# Patient Record
Sex: Male | Born: 1980 | Race: White | Hispanic: No | Marital: Single | Smoking: Current every day smoker
Health system: Southern US, Community
[De-identification: ages and names within clinical notes are randomized; demographics above are authoritative.]

## PROBLEM LIST (undated history)

## (undated) DIAGNOSIS — F419 Anxiety disorder, unspecified: Secondary | ICD-10-CM

## (undated) DIAGNOSIS — A549 Gonococcal infection, unspecified: Secondary | ICD-10-CM

## (undated) DIAGNOSIS — T7840XA Allergy, unspecified, initial encounter: Secondary | ICD-10-CM

## (undated) HISTORY — PX: FEMUR FRACTURE SURGERY: SHX633

## (undated) HISTORY — PX: TIBIA FRACTURE SURGERY: SHX806

## (undated) HISTORY — DX: Allergy, unspecified, initial encounter: T78.40XA

## (undated) HISTORY — PX: JOINT REPLACEMENT: SHX530

## (undated) HISTORY — DX: Anxiety disorder, unspecified: F41.9

---
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
## 2002-02-18 ENCOUNTER — Emergency Department (HOSPITAL_COMMUNITY): Admission: EM | Admit: 2002-02-18 | Discharge: 2002-02-18 | Payer: Self-pay | Admitting: Emergency Medicine

## 2002-03-03 ENCOUNTER — Emergency Department (HOSPITAL_COMMUNITY): Admission: EM | Admit: 2002-03-03 | Discharge: 2002-03-03 | Payer: Self-pay | Admitting: Emergency Medicine

## 2002-12-26 ENCOUNTER — Emergency Department (HOSPITAL_COMMUNITY): Admission: EM | Admit: 2002-12-26 | Discharge: 2002-12-26 | Payer: Self-pay | Admitting: Emergency Medicine

## 2002-12-26 ENCOUNTER — Encounter: Payer: Self-pay | Admitting: Emergency Medicine

## 2003-04-10 ENCOUNTER — Emergency Department (HOSPITAL_COMMUNITY): Admission: EM | Admit: 2003-04-10 | Discharge: 2003-04-10 | Payer: Self-pay | Admitting: Emergency Medicine

## 2003-04-10 ENCOUNTER — Encounter: Payer: Self-pay | Admitting: Emergency Medicine

## 2006-07-14 ENCOUNTER — Emergency Department: Payer: Self-pay | Admitting: Emergency Medicine

## 2006-07-21 ENCOUNTER — Emergency Department: Payer: Self-pay | Admitting: Emergency Medicine

## 2006-07-22 ENCOUNTER — Emergency Department: Payer: Self-pay

## 2006-12-29 ENCOUNTER — Emergency Department: Payer: Self-pay | Admitting: Emergency Medicine

## 2007-01-26 ENCOUNTER — Emergency Department: Payer: Self-pay | Admitting: Emergency Medicine

## 2007-01-26 ENCOUNTER — Emergency Department (HOSPITAL_COMMUNITY): Admission: EM | Admit: 2007-01-26 | Discharge: 2007-01-27 | Payer: Self-pay | Admitting: Emergency Medicine

## 2007-03-08 ENCOUNTER — Ambulatory Visit: Payer: Self-pay | Admitting: Family Medicine

## 2007-07-20 ENCOUNTER — Emergency Department: Payer: Self-pay | Admitting: Emergency Medicine

## 2007-07-23 ENCOUNTER — Emergency Department: Payer: Self-pay | Admitting: Emergency Medicine

## 2007-09-24 ENCOUNTER — Emergency Department: Payer: Self-pay | Admitting: Emergency Medicine

## 2007-12-23 ENCOUNTER — Emergency Department: Payer: Self-pay | Admitting: Emergency Medicine

## 2008-08-26 ENCOUNTER — Emergency Department: Payer: Self-pay | Admitting: Internal Medicine

## 2008-10-30 ENCOUNTER — Emergency Department (HOSPITAL_COMMUNITY): Admission: EM | Admit: 2008-10-30 | Discharge: 2008-10-30 | Payer: Self-pay | Admitting: Emergency Medicine

## 2009-05-26 ENCOUNTER — Emergency Department (HOSPITAL_COMMUNITY): Admission: EM | Admit: 2009-05-26 | Discharge: 2009-05-27 | Payer: Self-pay | Admitting: Emergency Medicine

## 2009-06-02 ENCOUNTER — Emergency Department (HOSPITAL_COMMUNITY): Admission: EM | Admit: 2009-06-02 | Discharge: 2009-06-02 | Payer: Self-pay | Admitting: Emergency Medicine

## 2009-06-10 ENCOUNTER — Emergency Department (HOSPITAL_COMMUNITY): Admission: EM | Admit: 2009-06-10 | Discharge: 2009-06-10 | Payer: Self-pay | Admitting: Emergency Medicine

## 2009-07-16 ENCOUNTER — Emergency Department (HOSPITAL_COMMUNITY): Admission: EM | Admit: 2009-07-16 | Discharge: 2009-07-16 | Payer: Self-pay | Admitting: Emergency Medicine

## 2010-09-26 ENCOUNTER — Emergency Department: Payer: Self-pay | Admitting: Emergency Medicine

## 2010-10-13 ENCOUNTER — Emergency Department (HOSPITAL_COMMUNITY): Admission: EM | Admit: 2010-10-13 | Discharge: 2010-10-13 | Payer: Self-pay | Admitting: Family Medicine

## 2010-10-21 ENCOUNTER — Emergency Department (HOSPITAL_COMMUNITY)
Admission: EM | Admit: 2010-10-21 | Discharge: 2010-10-21 | Payer: Self-pay | Source: Home / Self Care | Admitting: Emergency Medicine

## 2010-11-02 ENCOUNTER — Emergency Department (HOSPITAL_COMMUNITY)
Admission: EM | Admit: 2010-11-02 | Discharge: 2010-11-02 | Payer: Self-pay | Source: Home / Self Care | Admitting: Emergency Medicine

## 2010-12-09 ENCOUNTER — Emergency Department (HOSPITAL_COMMUNITY)
Admission: EM | Admit: 2010-12-09 | Discharge: 2010-12-10 | Payer: Self-pay | Source: Home / Self Care | Admitting: Emergency Medicine

## 2011-01-11 ENCOUNTER — Emergency Department (HOSPITAL_COMMUNITY)
Admission: EM | Admit: 2011-01-11 | Discharge: 2011-01-11 | Disposition: A | Discharge status: Home / Self Care | Payer: Self-pay | Attending: Emergency Medicine | Admitting: Emergency Medicine

## 2011-01-11 ENCOUNTER — Emergency Department (HOSPITAL_COMMUNITY): Payer: Self-pay

## 2011-01-11 DIAGNOSIS — IMO0002 Reserved for concepts with insufficient information to code with codable children: Secondary | ICD-10-CM | POA: Insufficient documentation

## 2011-01-11 DIAGNOSIS — M79609 Pain in unspecified limb: Secondary | ICD-10-CM | POA: Insufficient documentation

## 2011-01-27 LAB — COMPREHENSIVE METABOLIC PANEL
Alkaline Phosphatase: 80 U/L (ref 39–117)
BUN: 15 mg/dL (ref 6–23)
Glucose, Bld: 109 mg/dL — ABNORMAL HIGH (ref 70–99)
Potassium: 4.4 mEq/L (ref 3.5–5.1)
Total Bilirubin: 0.6 mg/dL (ref 0.3–1.2)
Total Protein: 7.2 g/dL (ref 6.0–8.3)

## 2011-01-27 LAB — POCT INFECTIOUS MONO SCREEN: Mono Screen: NEGATIVE

## 2011-01-27 LAB — CBC
HCT: 46.6 % (ref 39.0–52.0)
MCH: 28.1 pg (ref 26.0–34.0)
MCHC: 33.7 g/dL (ref 30.0–36.0)
MCV: 83.5 fL (ref 78.0–100.0)
RDW: 14.3 % (ref 11.5–15.5)
WBC: 12.3 10*3/uL — ABNORMAL HIGH (ref 4.0–10.5)

## 2011-01-27 LAB — DIFFERENTIAL
Basophils Absolute: 0 10*3/uL (ref 0.0–0.1)
Basophils Relative: 0 % (ref 0–1)
Monocytes Relative: 9 % (ref 3–12)
Neutro Abs: 9.8 10*3/uL — ABNORMAL HIGH (ref 1.7–7.7)
Neutrophils Relative %: 80 % — ABNORMAL HIGH (ref 43–77)

## 2011-02-21 LAB — GC/CHLAMYDIA PROBE AMP, GENITAL: GC Probe Amp, Genital: NEGATIVE

## 2011-03-16 ENCOUNTER — Emergency Department: Payer: Self-pay | Admitting: Emergency Medicine
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
## 2011-04-03 NOTE — Op Note (Signed)
[place]. [facility]  Patient:    JAMIEL, GONCALVES                    MRN: [id number] Proc. Date: 05/06/00 Adm. Date:  [id number] Disc. Date: [id number] Attending:  Burnard Bunting                           Operative Report  PREOPERATIVE DIAGNOSIS:  Open lateral leg incision following compartment release.  POSTOPERATIVE DIAGNOSIS:  Open lateral leg incision following compartment release.  PROCEDURE:  Leg irrigation and debridement and delayed primary closure of left lateral incision.  SURGEON:  Cammy Copa, M.D.  ANESTHESIA:  General endotracheal.  ESTIMATED BLOOD LOSS:  5 cc.  DRAINS:  None.  INDICATION FOR PROCEDURE:  Susy Frizzle Dillinger is a 30 year old patient who is now about 6 days out from left tibia IM nailing and compartment release.  The patient presents now for delayed primary closure of his lateral leg incision.  PROCEDURE IN DETAIL:  The patient was brought to the operating room were general endotracheal anesthesia was induced.  Preoperative I.V. antibiotics were administered.  The left leg was prepped with Hibiclens and saline and draped in a sterile manner.  The lateral thigh was also prepped.  The lateral incision was inspected.  It was then irrigated and debrided.  All muscle was viable.  Using manual traction, the skin could be closed primarily without undo tension.  This was performed using interrupted far-and-near, near-and-far 2-0 nylon sutures.  After the incision was closed the leg was wrapped in a bulking changed dressing.  The patient tolerated the procedure well without immediate complications. DD:  05/06/00 TD:  05/08/00 Job: [id number]
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
## 2011-04-03 NOTE — Op Note (Signed)
[place]. [facility]  Patient:    KELAN, PRITT                    MRN: [id number] Proc. Date: 05/02/00 Adm. Date:  [id number] Attending:  Burnard Bunting                           Operative Report  PREOPERATIVE DIAGNOSIS:  Left tibia fracture with compartment syndrome and open incisions.  POSTOPERATIVE DIAGNOSIS:  Left tibia fracture with compartment syndrome and open incisions.  PROCEDURE:  Left tibia fracture, irrigation and debridement of lateral compartment release incision, and medial compartment release incision with closure with reclosure of medial compartment incision over drain and partial closure of lateral incision.  SURGEON:  Cammy Copa, M.D.  ANESTHESIA:  General endotracheal.  ESTIMATED BLOOD LOSS:  50 cc.  DRAINS:  Hemovac x 1.  DESCRIPTION OF PROCEDURE:  The patient was brought to the operating room where general endotracheal anesthesia was induced.  The posterior splint and bulky Jones dressing were removed from the left leg.  A significant amount of serosanguineous drainage was noted to be coming from the closed medial incision.  The lateral incision was intact.  The sutures were removed from the lateral incision.  The muscle itself was viable.  No evidence of infection was noted.  The pulsatile lavage was used to irrigate the area.  The initial defect was oval in size and was approximately 13 cm x 7-8 cm across.  It was partially closed to about half of that area.  Using tension-relieving sutures, the lateral incision was partially closed to about half of that area.  At this time, the medial incision was inspected.  It was partially opened beginning proximally and there was noted to be a significant amount of hematoma within the dead space created by the fracture.  The rest of the incision was evacuated and the hematoma was evacuated.  Pulsatile lavage was used.  The incision was then closed back without tension using  interrupted inverted 2-0 Vicryl followed by 3-0 nylon sutures.  It was closed over a Hemovac drain. At this time, Adaptic and ______ were placed over the lateral incision.  The knee was then closed back.  A bulky Jones dressing and a posterior splint were then reapplied.  The patient tolerated the procedure well without immediate complications. DD:  05/02/00 TD:  05/05/00 Job: [id number]

## 2011-06-01 ENCOUNTER — Emergency Department: Payer: Self-pay | Admitting: Emergency Medicine

## 2011-08-20 LAB — DIFFERENTIAL
Basophils Absolute: 0.1 10*3/uL (ref 0.0–0.1)
Basophils Relative: 0 % (ref 0–1)
Neutro Abs: 13.1 10*3/uL — ABNORMAL HIGH (ref 1.7–7.7)
Neutrophils Relative %: 81 % — ABNORMAL HIGH (ref 43–77)

## 2011-08-20 LAB — POCT CARDIAC MARKERS
CKMB, poc: 1.3 ng/mL (ref 1.0–8.0)
CKMB, poc: <1 ng/mL — ABNORMAL LOW (ref 1.0–8.0)
Myoglobin, poc: 118 ng/mL (ref 12–200)
Myoglobin, poc: 61.8 ng/mL (ref 12–200)
Troponin i, poc: <0.05 ng/mL (ref 0.00–0.09)

## 2011-08-20 LAB — BASIC METABOLIC PANEL
BUN: 13 mg/dL (ref 6–23)
CO2: 23 mEq/L (ref 19–32)
Calcium: 9.6 mg/dL (ref 8.4–10.5)
Creatinine, Ser: 0.89 mg/dL (ref 0.4–1.5)
Glucose, Bld: 89 mg/dL (ref 70–99)

## 2011-08-20 LAB — RAPID URINE DRUG SCREEN, HOSP PERFORMED
Barbiturates: NOT DETECTED
Opiates: NOT DETECTED
Tetrahydrocannabinol: POSITIVE — AB

## 2011-08-20 LAB — CBC
MCHC: 33.8 g/dL (ref 30.0–36.0)
RDW: 14.3 % (ref 11.5–15.5)

## 2011-10-22 ENCOUNTER — Emergency Department (HOSPITAL_COMMUNITY)
Admission: EM | Admit: 2011-10-22 | Discharge: 2011-10-22 | Disposition: A | Discharge status: Home / Self Care | Payer: Self-pay | Attending: Emergency Medicine | Admitting: Emergency Medicine

## 2011-10-22 ENCOUNTER — Encounter: Payer: Self-pay | Admitting: Emergency Medicine

## 2011-10-22 DIAGNOSIS — J111 Influenza due to unidentified influenza virus with other respiratory manifestations: Secondary | ICD-10-CM | POA: Insufficient documentation

## 2011-10-22 MED ORDER — ALBUTEROL SULFATE (5 MG/ML) 0.5% IN NEBU
5.0000 mg | INHALATION_SOLUTION | Freq: Once | RESPIRATORY_TRACT | Status: AC
  Administered 2011-10-22: 5 mg via RESPIRATORY_TRACT
  Filled 2011-10-22: qty 1

## 2011-10-22 MED ORDER — ALBUTEROL SULFATE HFA 108 (90 BASE) MCG/ACT IN AERS
2.0000 | INHALATION_SPRAY | RESPIRATORY_TRACT | Status: DC | PRN
  Administered 2011-10-22: 2 via RESPIRATORY_TRACT
  Filled 2011-10-22: qty 6.7

## 2011-10-22 MED ORDER — IBUPROFEN 800 MG PO TABS
800.0000 mg | ORAL_TABLET | Freq: Once | ORAL | Status: AC
  Administered 2011-10-22: 800 mg via ORAL
  Filled 2011-10-22: qty 1

## 2011-10-22 NOTE — ED Provider Notes (Signed)
History     CSN: [id number] Arrival date & time: 10/22/2011  9:43 PM   First MD Initiated Contact with Patient 10/22/11 2303      Chief Complaint  Patient presents with  . Cough    (Consider location/radiation/quality/duration/timing/severity/associated sxs/prior treatment) HPI  Patient presents to ER complaining of gradual onset cough, body aches, nasal congestion that he woke with this morning and has been persistent throughout the day. Patient states he is currently staying in a shelter and "everyone in the shelter is sick." denies known fevers, but has felt hot and cold. Denies aggravating or alleviating factors. Has not taken anything for symptoms PTA. Denies resp difficulty, wheezing, HA, neck stiffness or sore throat. Denies known medical problems and takes no meds on regular basis.   History reviewed. No pertinent past medical history.  Past Surgical History  Procedure Date  . Joint replacement     No family history on file.  History  Substance Use Topics  . Smoking status: Passive Smoker -- 1.0 packs/day    Types: Cigarettes  . Smokeless tobacco: Not on file  . Alcohol Use:       Review of Systems  All other systems reviewed and are negative.    Allergies  Review of patient's allergies indicates no known allergies.  Home Medications  No current outpatient prescriptions on file.  BP 156/78  Pulse 78  Temp(Src) 98 F (36.7 C) (Oral)  Resp 16  Wt 200 lb (90.719 kg)  SpO2 97%  Physical Exam  Vitals reviewed. Constitutional: He is oriented to person, place, and time. He appears well-developed and well-nourished. No distress.  HENT:  Head: Normocephalic and atraumatic.  Right Ear: External ear normal.  Left Ear: External ear normal.  Nose: Nose normal.  Mouth/Throat: No oropharyngeal exudate.       Mild erythema of posterior pharynx and tonsils no tonsillar exudate or enlargement. Patent airway. Swallowing secretions well  Eyes: Conjunctivae and  EOM are normal. Pupils are equal, round, and reactive to light.  Neck: Normal range of motion. Neck supple.  Cardiovascular: Normal rate, regular rhythm and normal heart sounds.  Exam reveals no gallop and no friction rub.   No murmur heard. Pulmonary/Chest: Effort normal and breath sounds normal. No respiratory distress. He has no wheezes. He has no rales. He exhibits no tenderness.  Abdominal: Soft. He exhibits no distension and no mass. There is no tenderness. There is no rebound and no guarding.  Lymphadenopathy:    He has no cervical adenopathy.  Neurological: He is alert and oriented to person, place, and time. He has normal reflexes.  Skin: Skin is warm and dry. No rash noted. He is not diaphoretic.  Psychiatric: He has a normal mood and affect.    ED Course  Procedures (including critical care time)  Labs Reviewed - No data to display No results found.   1. Influenza       MDM  Non toxic appearing, afebrile. Influenza like symptoms without comorbidities.         Jenness Corner, [place] 10/22/11 2334

## 2011-10-22 NOTE — ED Notes (Signed)
[id number]<AV> Expected date:<BR> Expected time:<BR> Means of arrival:<BR> Comments:<BR> Terminal clean

## 2011-10-22 NOTE — ED Notes (Signed)
Pt alert, nad, c/o cough, body aches, onset this am, resp even unlabored, skin pwd, denies n/v, denies chest pain

## 2011-10-23 NOTE — ED Provider Notes (Signed)
Medical screening examination/treatment/procedure(s) were performed by non-physician practitioner and as supervising physician I was immediately available for consultation/collaboration.  Charmane Protzman Smitty Cords, MD 10/23/11 [phone]

## 2011-11-24 ENCOUNTER — Emergency Department: Payer: Self-pay | Admitting: *Deleted

## 2011-11-24 LAB — URINALYSIS, COMPLETE
Glucose,UR: NEGATIVE mg/dL (ref 0–75)
Leukocyte Esterase: NEGATIVE
Nitrite: NEGATIVE
Ph: 5 (ref 4.5–8.0)
Protein: NEGATIVE
RBC,UR: NONE SEEN /HPF (ref 0–5)

## 2013-01-19 ENCOUNTER — Emergency Department: Payer: Self-pay | Admitting: Emergency Medicine

## 2014-01-14 ENCOUNTER — Emergency Department: Payer: Self-pay | Admitting: Emergency Medicine

## 2014-06-16 ENCOUNTER — Emergency Department: Payer: Self-pay | Admitting: Emergency Medicine

## 2014-06-16 LAB — COMPREHENSIVE METABOLIC PANEL
ALBUMIN: 3.7 g/dL (ref 3.4–5.0)
AST: 26 U/L (ref 15–37)
Alkaline Phosphatase: 70 U/L
Anion Gap: 4 — ABNORMAL LOW (ref 7–16)
BILIRUBIN TOTAL: 0.3 mg/dL (ref 0.2–1.0)
BUN: 12 mg/dL (ref 7–18)
CHLORIDE: 109 mmol/L — AB (ref 98–107)
CO2: 26 mmol/L (ref 21–32)
Calcium, Total: 8.5 mg/dL (ref 8.5–10.1)
Creatinine: 1.11 mg/dL (ref 0.60–1.30)
GLUCOSE: 101 mg/dL — AB (ref 65–99)
OSMOLALITY: 277 (ref 275–301)
Potassium: 4 mmol/L (ref 3.5–5.1)
SGPT (ALT): 25 U/L
Sodium: 139 mmol/L (ref 136–145)
TOTAL PROTEIN: 7.4 g/dL (ref 6.4–8.2)

## 2014-06-16 LAB — DRUG SCREEN, URINE
Amphetamines, Ur Screen: NEGATIVE (ref ?–1000)
Barbiturates, Ur Screen: NEGATIVE (ref ?–200)
Benzodiazepine, Ur Scrn: NEGATIVE (ref ?–200)
Cannabinoid 50 Ng, Ur ~~LOC~~: POSITIVE (ref ?–50)
Cocaine Metabolite,Ur ~~LOC~~: NEGATIVE (ref ?–300)
MDMA (Ecstasy)Ur Screen: NEGATIVE (ref ?–500)
METHADONE, UR SCREEN: NEGATIVE (ref ?–300)
Opiate, Ur Screen: NEGATIVE (ref ?–300)
PHENCYCLIDINE (PCP) UR S: NEGATIVE (ref ?–25)
Tricyclic, Ur Screen: NEGATIVE (ref ?–1000)

## 2014-06-16 LAB — URINALYSIS, COMPLETE
BLOOD: NEGATIVE
Bacteria: NONE SEEN
Bilirubin,UR: NEGATIVE
Glucose,UR: NEGATIVE mg/dL (ref 0–75)
Ketone: NEGATIVE
LEUKOCYTE ESTERASE: NEGATIVE
NITRITE: NEGATIVE
PH: 5 (ref 4.5–8.0)
PROTEIN: NEGATIVE
RBC,UR: <1 /HPF (ref 0–5)
Specific Gravity: 1.023 (ref 1.003–1.030)
Squamous Epithelial: NONE SEEN

## 2014-06-16 LAB — ACETAMINOPHEN LEVEL: Acetaminophen: <2 ug/mL

## 2014-06-16 LAB — CBC
HCT: 46.5 % (ref 40.0–52.0)
HGB: 14.8 g/dL (ref 13.0–18.0)
MCH: 27 pg (ref 26.0–34.0)
MCHC: 31.7 g/dL — AB (ref 32.0–36.0)
MCV: 85 fL (ref 80–100)
Platelet: 347 10*3/uL (ref 150–440)
RBC: 5.45 10*6/uL (ref 4.40–5.90)
RDW: 15.3 % — AB (ref 11.5–14.5)
WBC: 10 10*3/uL (ref 3.8–10.6)

## 2014-06-16 LAB — SALICYLATE LEVEL: Salicylates, Serum: 2 mg/dL

## 2014-06-16 LAB — ETHANOL
ETHANOL %: 0.003 % (ref 0.000–0.080)
ETHANOL LVL: 3 mg/dL

## 2014-07-23 ENCOUNTER — Emergency Department: Payer: Self-pay | Admitting: Student

## 2014-08-04 ENCOUNTER — Emergency Department: Payer: Self-pay | Admitting: Emergency Medicine
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
## 2015-12-28 ENCOUNTER — Encounter (HOSPITAL_COMMUNITY): Payer: Self-pay

## 2015-12-28 ENCOUNTER — Emergency Department (HOSPITAL_COMMUNITY)
Admission: EM | Admit: 2015-12-28 | Discharge: 2015-12-28 | Disposition: A | Discharge status: Home / Self Care | Attending: Emergency Medicine | Admitting: Emergency Medicine

## 2015-12-28 ENCOUNTER — Emergency Department (HOSPITAL_COMMUNITY)

## 2015-12-28 DIAGNOSIS — R1030 Lower abdominal pain, unspecified: Secondary | ICD-10-CM

## 2015-12-28 DIAGNOSIS — N390 Urinary tract infection, site not specified: Secondary | ICD-10-CM | POA: Insufficient documentation

## 2015-12-28 DIAGNOSIS — R110 Nausea: Secondary | ICD-10-CM | POA: Insufficient documentation

## 2015-12-28 DIAGNOSIS — R1084 Generalized abdominal pain: Secondary | ICD-10-CM | POA: Diagnosis present

## 2015-12-28 LAB — URINALYSIS, ROUTINE W REFLEX MICROSCOPIC
Bilirubin Urine: NEGATIVE
Glucose, UA: NEGATIVE mg/dL
Ketones, ur: NEGATIVE mg/dL
LEUKOCYTES UA: NEGATIVE
Nitrite: NEGATIVE
Protein, ur: >300 mg/dL — AB
Specific Gravity, Urine: 1.02 (ref 1.005–1.030)
pH: 5.5 (ref 5.0–8.0)

## 2015-12-28 LAB — URINE MICROSCOPIC-ADD ON

## 2015-12-28 LAB — COMPREHENSIVE METABOLIC PANEL
ALBUMIN: 4.3 g/dL (ref 3.5–5.0)
ALK PHOS: 93 U/L (ref 38–126)
ALT: 18 U/L (ref 17–63)
AST: 22 U/L (ref 15–41)
Anion gap: 11 (ref 5–15)
BILIRUBIN TOTAL: 0.6 mg/dL (ref 0.3–1.2)
BUN: 19 mg/dL (ref 6–20)
CALCIUM: 9 mg/dL (ref 8.9–10.3)
CO2: 24 mmol/L (ref 22–32)
Chloride: 104 mmol/L (ref 101–111)
Creatinine, Ser: 1.66 mg/dL — ABNORMAL HIGH (ref 0.61–1.24)
GFR calc Af Amer: >60 mL/min (ref >60)
GFR calc non Af Amer: 53 mL/min — ABNORMAL LOW (ref >60)
GLUCOSE: 105 mg/dL — AB (ref 65–99)
Potassium: 3.9 mmol/L (ref 3.5–5.1)
Sodium: 139 mmol/L (ref 135–145)
TOTAL PROTEIN: 7.3 g/dL (ref 6.5–8.1)

## 2015-12-28 LAB — CBC WITH DIFFERENTIAL/PLATELET
BASOS ABS: 0 10*3/uL (ref 0.0–0.1)
BASOS PCT: 0 %
EOS PCT: 0 %
Eosinophils Absolute: 0 10*3/uL (ref 0.0–0.7)
HCT: 42.8 % (ref 39.0–52.0)
Hemoglobin: 14.2 g/dL (ref 13.0–17.0)
LYMPHS PCT: 9 %
Lymphs Abs: 1.4 10*3/uL (ref 0.7–4.0)
MCH: 26.9 pg (ref 26.0–34.0)
MCHC: 33.2 g/dL (ref 30.0–36.0)
MCV: 81.2 fL (ref 78.0–100.0)
Monocytes Absolute: 2.2 10*3/uL — ABNORMAL HIGH (ref 0.1–1.0)
Monocytes Relative: 14 %
Neutro Abs: 12 10*3/uL — ABNORMAL HIGH (ref 1.7–7.7)
Neutrophils Relative %: 77 %
Platelets: 377 10*3/uL (ref 150–400)
RBC: 5.27 MIL/uL (ref 4.22–5.81)
RDW: 14.3 % (ref 11.5–15.5)
WBC: 15.7 10*3/uL — ABNORMAL HIGH (ref 4.0–10.5)

## 2015-12-28 MED ORDER — IOHEXOL 300 MG/ML  SOLN
100.0000 mL | Freq: Once | INTRAMUSCULAR | Status: AC | PRN
  Administered 2015-12-28: 100 mL via INTRAVENOUS

## 2015-12-28 MED ORDER — FENTANYL CITRATE (PF) 100 MCG/2ML IJ SOLN
50.0000 ug | Freq: Once | INTRAMUSCULAR | Status: AC
  Administered 2015-12-28: 50 ug via INTRAVENOUS
  Filled 2015-12-28: qty 2

## 2015-12-28 MED ORDER — SODIUM CHLORIDE 0.9 % IV BOLUS (SEPSIS)
1000.0000 mL | Freq: Once | INTRAVENOUS | Status: AC
  Administered 2015-12-28: 1000 mL via INTRAVENOUS

## 2015-12-28 MED ORDER — DEXTROSE 5 % IV SOLN
1.0000 g | Freq: Once | INTRAVENOUS | Status: AC
  Administered 2015-12-28: 1 g via INTRAVENOUS
  Filled 2015-12-28: qty 10

## 2015-12-28 MED ORDER — SODIUM CHLORIDE 0.9 % IV BOLUS (SEPSIS): 1000.0000 mL | Freq: Once | INTRAVENOUS | Status: DC

## 2015-12-28 MED ORDER — CIPROFLOXACIN HCL 500 MG PO TABS: 500.0000 mg | ORAL_TABLET | Freq: Two times a day (BID) | ORAL | Status: DC

## 2015-12-28 MED ORDER — KETOROLAC TROMETHAMINE 30 MG/ML IJ SOLN
30.0000 mg | Freq: Once | INTRAMUSCULAR | Status: AC
  Administered 2015-12-28: 30 mg via INTRAVENOUS
  Filled 2015-12-28: qty 1

## 2015-12-28 MED ORDER — ONDANSETRON HCL 4 MG/2ML IJ SOLN
4.0000 mg | Freq: Once | INTRAMUSCULAR | Status: AC
  Administered 2015-12-28: 4 mg via INTRAVENOUS
  Filled 2015-12-28: qty 2

## 2015-12-28 NOTE — ED Provider Notes (Signed)
CSN: [id number]     Arrival date & time 12/28/15  0118 History   First MD Initiated Contact with Patient 12/28/15 0205   Chief Complaint  Patient presents with  . Abdominal Pain     (Consider location/radiation/quality/duration/timing/severity/associated sxs/prior Treatment) HPI patient reports about 10 PM tonight he started getting diffuse pain below his umbilicus. He can only describe the pain as "hurts". He states the pain is constant. He has had nausea without vomiting or diarrhea. He denies dysuria, frequency, or fever. He states he's never had this pain before. He states coughing and movement makes the pain worse. He states nothing makes it feel better.   PCP none  History reviewed. No pertinent past medical history. Past Surgical History  Procedure Laterality Date  . Joint replacement     No family history on file. Social History  Substance Use Topics  . Smoking status: Passive Smoke Exposure - Never Smoker -- 1.00 packs/day    Types: Cigarettes  . Smokeless tobacco: None  . Alcohol Use: No  incarcerated 9 months Quit smoking 9 months ago  Review of Systems  All other systems reviewed and are negative.     Allergies  Review of patient's allergies indicates no known allergies.  Home Medications   Prior to Admission medications   Medication Sig Start Date End Date Taking? Authorizing Provider  ciprofloxacin (CIPRO) 500 MG tablet Take 1 tablet (500 mg total) by mouth 2 (two) times daily. 12/28/15   Devoria Albe, MD   BP 123/74 mmHg  Pulse 79  Temp(Src) 98.6 F (37 C)  Resp 18  Ht 6' (1.829 m)  Wt 259 lb (117.482 kg)  BMI 35.12 kg/m2  SpO2 99%  Vital signs normal   Physical Exam  Constitutional: He is oriented to person, place, and time. He appears well-developed and well-nourished.  Non-toxic appearance. He does not appear ill. He appears distressed.  Patient is moaning  HENT:  Head: Normocephalic and atraumatic.  Right Ear: External ear normal.  Left  Ear: External ear normal.  Nose: Nose normal. No mucosal edema or rhinorrhea.  Mouth/Throat: Oropharynx is clear and moist and mucous membranes are normal. No dental abscesses or uvula swelling.  Eyes: Conjunctivae and EOM are normal. Pupils are equal, round, and reactive to light.  Neck: Normal range of motion and full passive range of motion without pain. Neck supple.  Cardiovascular: Normal rate, regular rhythm and normal heart sounds.  Exam reveals no gallop and no friction rub.   No murmur heard. Pulmonary/Chest: Effort normal and breath sounds normal. No respiratory distress. He has no wheezes. He has no rhonchi. He has no rales. He exhibits no tenderness and no crepitus.  Abdominal: Soft. Normal appearance and bowel sounds are normal. He exhibits no distension. There is tenderness. There is no rebound and no guarding.  Patient has diffuse abdominal pain and states when I palpate his upper abdomen it hurts in his lower abdomen, he is diffusely tender in the lower abdomen and states the worst pain is in the left lower quadrant.  Musculoskeletal: Normal range of motion. He exhibits no edema or tenderness.  Moves all extremities well.   Neurological: He is alert and oriented to person, place, and time. He has normal strength. No cranial nerve deficit.  Skin: Skin is warm, dry and intact. No rash noted. No erythema. No pallor.  Psychiatric: He has a normal mood and affect. His speech is normal and behavior is normal. His mood appears not anxious.  Nursing  note and vitals reviewed.   ED Course  Procedures (including critical care time)  Medications  sodium chloride 0.9 % bolus 1,000 mL (not administered)  sodium chloride 0.9 % bolus 1,000 mL (0 mLs Intravenous Stopped 12/28/15 0323)  fentaNYL (SUBLIMAZE) injection 50 mcg (50 mcg Intravenous Given 12/28/15 0225)  ondansetron (ZOFRAN) injection 4 mg (4 mg Intravenous Given 12/28/15 0225)  iohexol (OMNIPAQUE) 300 MG/ML solution 100 mL (100 mLs  Intravenous Contrast Given 12/28/15 0339)  sodium chloride 0.9 % bolus 1,000 mL (1,000 mLs Intravenous New Bag/Given 12/28/15 0417)  ketorolac (TORADOL) 30 MG/ML injection 30 mg (30 mg Intravenous Given 12/28/15 0417)  cefTRIAXone (ROCEPHIN) 1 g in dextrose 5 % 50 mL IVPB (1 g Intravenous New Bag/Given 12/28/15 0507)    Patient was given IV fluids, IV pain and nausea medication.  After reviewing patient CT scan he was given IV Toradol for pain because patient was still moaning.  After reviewing patient's urinalysis he was given Rocephin IV. Patient will be discharged home with Cipro twice a day for 2 weeks for possible UTI that is spreading to the renals based on CT scan. Patient should return if he gets high fever, vomiting, or if he is not improving in the next couple days. Patient was given IV fluids for his renal insufficiency. Hopefully this is a volume depletion  and will improve with hydration.   Labs Review Results for orders placed or performed during the hospital encounter of 12/28/15  Comprehensive metabolic panel  Result Value Ref Range   Sodium 139 135 - 145 mmol/L   Potassium 3.9 3.5 - 5.1 mmol/L   Chloride 104 101 - 111 mmol/L   CO2 24 22 - 32 mmol/L   Glucose, Bld 105 (H) 65 - 99 mg/dL   BUN 19 6 - 20 mg/dL   Creatinine, Ser 1.61 (H) 0.61 - 1.24 mg/dL   Calcium 9.0 8.9 - 09.6 mg/dL   Total Protein 7.3 6.5 - 8.1 g/dL   Albumin 4.3 3.5 - 5.0 g/dL   AST 22 15 - 41 U/L   ALT 18 17 - 63 U/L   Alkaline Phosphatase 93 38 - 126 U/L   Total Bilirubin 0.6 0.3 - 1.2 mg/dL   GFR calc non Af Amer 53 (L) >60 mL/min   GFR calc Af Amer >60 >60 mL/min   Anion gap 11 5 - 15  CBC with Differential  Result Value Ref Range   WBC 15.7 (H) 4.0 - 10.5 K/uL   RBC 5.27 4.22 - 5.81 MIL/uL   Hemoglobin 14.2 13.0 - 17.0 g/dL   HCT 04.5 40.9 - 81.1 %   MCV 81.2 78.0 - 100.0 fL   MCH 26.9 26.0 - 34.0 pg   MCHC 33.2 30.0 - 36.0 g/dL   RDW 91.4 78.2 - 95.6 %   Platelets 377 150 - 400 K/uL    Neutrophils Relative % 77 %   Neutro Abs 12.0 (H) 1.7 - 7.7 K/uL   Lymphocytes Relative 9 %   Lymphs Abs 1.4 0.7 - 4.0 K/uL   Monocytes Relative 14 %   Monocytes Absolute 2.2 (H) 0.1 - 1.0 K/uL   Eosinophils Relative 0 %   Eosinophils Absolute 0.0 0.0 - 0.7 K/uL   Basophils Relative 0 %   Basophils Absolute 0.0 0.0 - 0.1 K/uL  Urinalysis, Routine w reflex microscopic  Result Value Ref Range   Color, Urine YELLOW YELLOW   APPearance CLEAR CLEAR   Specific Gravity, Urine 1.020 1.005 - 1.030  pH 5.5 5.0 - 8.0   Glucose, UA NEGATIVE NEGATIVE mg/dL   Hgb urine dipstick SMALL (A) NEGATIVE   Bilirubin Urine NEGATIVE NEGATIVE   Ketones, ur NEGATIVE NEGATIVE mg/dL   Protein, ur >725 (A) NEGATIVE mg/dL   Nitrite NEGATIVE NEGATIVE   Leukocytes, UA NEGATIVE NEGATIVE  Urine microscopic-add on  Result Value Ref Range   Squamous Epithelial / LPF 0-5 (A) NONE SEEN   WBC, UA 6-30 0 - 5 WBC/hpf   RBC / HPF 0-5 0 - 5 RBC/hpf   Bacteria, UA FEW (A) NONE SEEN   Laboratory interpretation all normal except possible UTI, leukocytosis, renal insuffiency   Imaging Review Ct Abdomen Pelvis W Contrast  12/28/2015  CLINICAL DATA:  35 year old male with diffuse lower abdominal pain EXAM: CT ABDOMEN AND PELVIS WITH CONTRAST TECHNIQUE: Multidetector CT imaging of the abdomen and pelvis was performed using the standard protocol following bolus administration of intravenous contrast. CONTRAST:  OMNIPAQUE IOHEXOL 300 MG/ML  SOLN COMPARISON:  None. FINDINGS: The visualized lung bases are clear. No intra-abdominal free air or free fluid. Mild apparent diffuse hepatic steatosis. The liver is otherwise unremarkable. No calcified gallstone identified. The pancreas, spleen, adrenal glands appear unremarkable. There is no hydronephrosis on either side. There is minimal bilateral perinephric haziness. Correlation with urinalysis recommended to exclude UTI. The urinary bladder and the visualized ureters are grossly  unremarkable. The prostate and seminal vesicles appear unremarkable. There is no evidence of bowel obstruction or inflammation. Normal appendix. The abdominal aorta and IVC appear unremarkable. No portal venous gas identified. There is no adenopathy. The abdominal wall soft tissues appear unremarkable. 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 No acute fracture. IMPRESSION: No acute intra-abdominal or pelvic pathology identified. Minimal perinephric haziness. Correlation with urinalysis recommended to exclude UTI. Electronically Signed   By: Elgie Collard M.D.   On: 12/28/2015 04:01   I have personally reviewed and evaluated these images and lab results as part of my medical decision-making.    MDM   Final diagnoses:  Lower abdominal pain  Urinary tract infection without hematuria, site unspecified    New Prescriptions   CIPROFLOXACIN (CIPRO) 500 MG TABLET    Take 1 tablet (500 mg total) by mouth 2 (two) times daily.    Plan discharge  Devoria Albe, MD, Concha Pyo, MD 12/28/15 [phone]

## 2015-12-28 NOTE — Discharge Instructions (Signed)
Drink plenty of fluids. Take the antibiotics twice a day for 2 weeks. You can have ibuprofen 600 mg and/or acetaminophen 1000 mg 4 times a day for pain.   Recheck if you get a fever, vomiting or not improving in the next 2-3 days. A urine culture was sent tonight.    Urinary Tract Infection Urinary tract infections (UTIs) can develop anywhere along your urinary tract. Your urinary tract is your body's drainage system for removing wastes and extra water. Your urinary tract includes two kidneys, two ureters, a bladder, and a urethra. Your kidneys are a pair of bean-shaped organs. Each kidney is about the size of your fist. They are located below your ribs, one on each side of your spine. CAUSES Infections are caused by microbes, which are microscopic organisms, including fungi, viruses, and bacteria. These organisms are so small that they can only be seen through a microscope. Bacteria are the microbes that most commonly cause UTIs. SYMPTOMS  Symptoms of UTIs may vary by age and gender of the patient and by the location of the infection. Symptoms in young women typically include a frequent and intense urge to urinate and a painful, burning feeling in the bladder or urethra during urination. Older women and men are more likely to be tired, shaky, and weak and have muscle aches and abdominal pain. A fever may mean the infection is in your kidneys. Other symptoms of a kidney infection include pain in your back or sides below the ribs, nausea, and vomiting. DIAGNOSIS To diagnose a UTI, your caregiver will ask you about your symptoms. Your caregiver will also ask you to provide a urine sample. The urine sample will be tested for bacteria and white blood cells. White blood cells are made by your body to help fight infection. TREATMENT  Typically, UTIs can be treated with medication. Because most UTIs are caused by a bacterial infection, they usually can be treated with the use of antibiotics. The choice of  antibiotic and length of treatment depend on your symptoms and the type of bacteria causing your infection. HOME CARE INSTRUCTIONS  If you were prescribed antibiotics, take them exactly as your caregiver instructs you. Finish the medication even if you feel better after you have only taken some of the medication.  Drink enough water and fluids to keep your urine clear or pale yellow.  Avoid caffeine, tea, and carbonated beverages. They tend to irritate your bladder.  Empty your bladder often. Avoid holding urine for long periods of time.  Empty your bladder before and after sexual intercourse.  After a bowel movement, women should cleanse from front to back. Use each tissue only once. SEEK MEDICAL CARE IF:   You have back pain.  You develop a fever.  Your symptoms do not begin to resolve within 3 days. SEEK IMMEDIATE MEDICAL CARE IF:   You have severe back pain or lower abdominal pain.  You develop chills.  You have nausea or vomiting.  You have continued burning or discomfort with urination. MAKE SURE YOU:   Understand these instructions.  Will watch your condition.  Will get help right away if you are not doing well or get worse.   This information is not intended to replace advice given to you by your health care provider. Make sure you discuss any questions you have with your health care provider.   Document Released: 08/12/2005 Document Revised: 07/24/2015 Document Reviewed: 12/11/2011 Elsevier Interactive Patient Education [employer]

## 2015-12-28 NOTE — ED Notes (Signed)
Pt reports improvement in pain but states he still "feels" it. CT notified the patient was finished drinking contrast.

## 2015-12-28 NOTE — ED Notes (Signed)
Pt is from [facility] with c/o abd pain that started approx 10 pm, nausea, no vomiting and diarrhea.

## 2015-12-28 NOTE — ED Notes (Signed)
Pt reports diffuse lower abdominal pain with nausea, denies vomiting. Pt also reports chills. Pt denies any trouble with BM or urination

## 2015-12-30 LAB — URINE CULTURE
Culture: NO GROWTH
Special Requests: NORMAL

## 2016-08-20 ENCOUNTER — Ambulatory Visit: Payer: Self-pay | Attending: Orthopedic Surgery | Admitting: Physical Therapy

## 2016-08-20 ENCOUNTER — Encounter: Payer: Self-pay | Admitting: Physical Therapy

## 2016-08-20 DIAGNOSIS — M25661 Stiffness of right knee, not elsewhere classified: Secondary | ICD-10-CM | POA: Insufficient documentation

## 2016-08-20 DIAGNOSIS — M6281 Muscle weakness (generalized): Secondary | ICD-10-CM | POA: Insufficient documentation

## 2016-08-20 DIAGNOSIS — M79604 Pain in right leg: Secondary | ICD-10-CM | POA: Insufficient documentation

## 2016-08-20 DIAGNOSIS — R262 Difficulty in walking, not elsewhere classified: Secondary | ICD-10-CM | POA: Insufficient documentation

## 2016-08-20 DIAGNOSIS — M62838 Other muscle spasm: Secondary | ICD-10-CM | POA: Insufficient documentation
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
## 2016-08-25 ENCOUNTER — Ambulatory Visit: Payer: Self-pay | Admitting: Physical Therapy

## 2016-08-25 ENCOUNTER — Encounter: Payer: Self-pay | Admitting: Physical Therapy

## 2016-08-25 DIAGNOSIS — M79604 Pain in right leg: Secondary | ICD-10-CM

## 2016-08-25 DIAGNOSIS — R262 Difficulty in walking, not elsewhere classified: Secondary | ICD-10-CM

## 2016-08-25 DIAGNOSIS — M62838 Other muscle spasm: Secondary | ICD-10-CM

## 2016-08-25 DIAGNOSIS — M6281 Muscle weakness (generalized): Secondary | ICD-10-CM

## 2016-08-26 NOTE — Therapy (Signed)
[place] [facility] [address] [place], [address] Phone: [phone]   Fax:  [phone]  Physical Therapy Treatment  Patient Details  Name: Peter Stanley MRN: [id number] Date of Birth: [birth year] Referring Provider: Abram Sander MD  Encounter Date: 08/25/2016      PT End of Session - 08/25/16 1715    Visit Number 2   Number of Visits 36   Date for PT Re-Evaluation 11/13/16   PT Start Time 1630   PT Stop Time 1715   PT Time Calculation (min) 45 min   Activity Tolerance Patient limited by pain   Behavior During Therapy [facility] for tasks assessed/performed;Anxious      Past Medical History:  Diagnosis Date  . Allergy   . Anxiety     History reviewed. No pertinent surgical history.  There were no vitals filed for this visit.      Subjective Assessment - 08/25/16 1632    Subjective Patient reports he had improved flexiblity in right knee following patella mobilization and estim. previous session. He still has stiffness in right knee and weakness that limits ability to walk without assistive device.    Limitations Sitting;Standing;Walking;House hold activities;Other (comment)  personal care, work    Patient Stated Goals to be able to perform personal care activites and return to normal activites without limitaitons (prior level of function)   Currently in Pain? Yes   Pain Score 5    Pain Location Leg  right knee   Pain Orientation Right   Pain Descriptors / Indicators Tightness;Spasm;Aching   Pain Type Acute pain;Surgical pain  05/30/2016   Pain Onset More than a month ago   Pain Frequency Constant     Objective:  Gait: ambulating with walker WBAT right LE, right knee in extension, (patient required VC to walk with walker with correct sequencing for safety)  Treatment:  Manual therapy: STM right quadriceps and hamstring  muscles superficial techniques to improve mobility/decrease pain  with patient supine partially reclined and right LE on pillow Patellar mobilization with patella mobilizer: distraction with inferior, medial, lateral glides x 3 reps  Therapeutic exercise: patient performed exercises with guidance, verbal and tactile cues and demonstration of PT: AAROM right knee/ankle/hip 2 x 3 reps Quad setting with and without estim. Assisted SLR 2 x 3 reps  Modalities:  Electrical stimulation; Russian stim. 10/10 cycle right VMO/quadriceps x 20 min. With patient in reclined position with pillow and towel roll under right knee/LE Goal: muscle re education  Patient response to treatment: Patient with improved AAROM to 25/30 degrees knee flexion following STM and exercises with assistance of therapist. Patient with improved motor control and soft tissue elasticity with manual techniques and estim.      PT Education - 08/25/16 1715    Education provided Yes   Education Details HEP: continue with exercises as instructed for quad setting, ROM, walking with increased weight bearing as tolerated   Person(s) Educated Patient   Methods Explanation;Demonstration;Verbal cues   Comprehension Verbalized understanding;Returned demonstration;Verbal cues required             PT Long Term Goals - 08/20/16 1733      PT LONG TERM GOAL #1   Title Patient will improve AAROM right knee up to 90 degrees by 09/16/2016 to allow him to sit and walk with more normal gait pattern and to allow decreased pain in knee   Baseline AAROM right knee 0-10 degrees flexion with pain  Status New     PT LONG TERM GOAL #2   Title patient will improve LEFS to 25/80 by 09/20/16 to demonstrate improving function with daily activities    Baseline LEFS 0/80   Status New     PT LONG TERM GOAL #3   Title patient will have pain level max of 6/10 by 10/20/2016 to allow improved function with moving, sitting and walking   Baseline pain level max 10/10 with aggravating activities   Status New     PT  LONG TERM GOAL #4   Title imrpoved LEFS to 50/80 or better by 11/13/2016 indicating improved function with daily tasks/walking/personal care   Baseline LEFS 0/80   Status New     PT LONG TERM GOAL #5   Title patient will be able to walk with least restrictive device safely on level surfaces and stairs by 11/13/2016 to improve function with household and community activities   Baseline walking with rolling walker, WBAT RLE (non weight bearing due to pain)   Status New     Additional Long Term Goals   Additional Long Term Goals Yes     PT LONG TERM GOAL #6   Title patient will be independent with home program for pain control, flexibiltiy, strength for right LE by discharge 11/13/16 in order to continue to improve function with self management   Baseline requires assistance and guidance for all exercises, limited knowledge of appropriate pain control strategies, progression of exercises    Status New               Plan - 08/25/16 1725    Clinical Impression Statement Patient demonstrated improved ROM from 10 to 25 degrees and improved soft tissue mobiltiy in right thigh and hamstring with treatment. He continues with severe decrease in ROM and  strength right LE and requires additional physical therapy intervention to achieve goals.    Rehab Potential Good   PT Frequency 3x / week   PT Duration 12 weeks   PT Treatment/Interventions Electrical Stimulation;Gait training;Moist Heat;Therapeutic activities;Therapeutic exercise;Balance training;Neuromuscular re-education;Patient/family education;Passive range of motion;Scar mobilization;Manual techniques   PT Next Visit Plan modalities for pain control, muscle spasms, manual soft tissue mobilization, therapeutic exercise for ROM, gait   PT Home Exercise Plan ROM, quad sets every hour, foot intrinsic exercises, pain control      Patient will benefit from skilled therapeutic intervention in order to improve the following deficits and  impairments:  Decreased strength, Decreased balance, Hypomobility, Impaired flexibility, Pain, Impaired perceived functional ability, Decreased activity tolerance, Decreased endurance, Increased muscle spasms, Difficulty walking, Decreased range of motion  Visit Diagnosis: Pain in right leg  Difficulty in walking, not elsewhere classified  Other muscle spasm  Muscle weakness (generalized)     Problem List There are no active problems to display for this patient.   Beacher MayBrooks, Marie PT 08/26/2016, 11:02 PM  [facility] [address] [place], [place], [address] Phone: [phone]   Fax:  [phone]  Name: Fabienne BrunsMatthew T Alcaide MRN: [id number] Date of Birth: [birth year]

## 2016-08-27 ENCOUNTER — Ambulatory Visit: Payer: Self-pay | Admitting: Physical Therapy

## 2016-08-27 DIAGNOSIS — M62838 Other muscle spasm: Secondary | ICD-10-CM

## 2016-08-27 DIAGNOSIS — M6281 Muscle weakness (generalized): Secondary | ICD-10-CM

## 2016-08-27 DIAGNOSIS — M79604 Pain in right leg: Secondary | ICD-10-CM

## 2016-08-27 DIAGNOSIS — R262 Difficulty in walking, not elsewhere classified: Secondary | ICD-10-CM

## 2016-08-28 NOTE — Therapy (Signed)
[place] [facility] [address] [place], [place], [address] Phone: [phone]   Fax:  [phone]  Physical Therapy Treatment  Patient Details  Name: Peter Stanley MRN: [id number] Date of Birth: [birth year] Referring Provider: Abram Sanderpatterson, jennifer MD  Encounter Date: 08/27/2016      PT End of Session - 08/27/16 1731    Visit Number 3   Number of Visits 36   Date for PT Re-Evaluation 11/13/16   PT Start Time 1630   PT Stop Time 1730   PT Time Calculation (min) 60 min   Activity Tolerance Patient limited by pain;Patient limited by fatigue   Behavior [facility] for tasks assessed/performed;Anxious      Past Medical History:  Diagnosis Date  . Allergy   . Anxiety     No past surgical history on file.  There were no vitals filed for this visit.      Subjective Assessment - 08/27/16 1632    Subjective Patient reports he is noticing his right knee is getting looser with exercises and repetition. He still has difficulty with placing right foot on floor and cannot bend his right knee as he would like due to pain.    Limitations Sitting;Standing;Walking;House hold activities;Other (comment)   Patient Stated Goals to be able to perform personal care activites and return to normal activites without limitaitons (prior level of function)   Currently in Pain? Yes   Pain Score 5    Pain Location Knee   Pain Orientation Right   Pain Descriptors / Indicators Tightness;Spasm   Pain Type Acute pain;Surgical pain  05/30/2016   Pain Onset More than a month ago      Objective:  Gait: ambulating with 2 wheeled walker WBAT right LE, right knee in extension, difficulty placing right foot on floor Palpation: decreased soft tissue mobility along right knee, lateral thigh and quadriceps muscle with + spasms palpable throughout thigh, hamstring muslces Treatment:  Manual therapy: STM right quadriceps and hamstring   muscles superficial techniques to improve mobility/decrease pain with patient supine partially reclined and right LE on pillow Patellar mobilization with patella mobilizer: distraction with inferior, medial, lateral glides x 3 reps  Therapeutic exercise: patient performed exercises with guidance, verbal and tactile cues and demonstration of PT: AAROM right knee/ankle/hip 2 x 3 reps with patient supine, reclined with knee supported  Supine lying hip/knee flexion, extension with isometric hold at end ranges extension, flexion of knee 3 sets, 3-5 reps Quad setting with and without estim.  Modalities:  Electrical stimulation; Russian stim. 10/10 cycle right VMO/quadriceps x 20 min. With patient in reclined position with pillow and towel roll under right knee/LE Goal: muscle re education  Patient response to treatment: Patient with improved soft tissue mobility and decreased pain PF joint and quadriceps muscle. Improved AAROM following isometric exercises for knee flexion. AAROM following exercises and STM up to 35 degrees knee flexion. Patient demonstrated improved motor control following estim. Exercises limited by pain in right knee/LE        PT Education - 08/27/16 1730    Education provided Yes   Education Details HEP: instructed in ROM with assistance and demonstated for wife to assist with exercises   Person(s) Educated Patient;Spouse   Methods Explanation;Demonstration;Verbal cues   Comprehension Verbalized understanding;Returned demonstration;Verbal cues required             PT Long Term Goals - 08/20/16 1733      PT LONG TERM GOAL #  1   Title Patient will improve AAROM right knee up to 90 degrees by 09/16/2016 to allow him to sit and walk with more normal gait pattern and to allow decreased pain in knee   Baseline AAROM right knee 0-10 degrees flexion with pain   Status New     PT LONG TERM GOAL #2   Title patient will improve LEFS to 25/80 by 09/20/16 to demonstrate  improving function with daily activities    Baseline LEFS 0/80   Status New     PT LONG TERM GOAL #3   Title patient will have pain level max of 6/10 by 10/20/2016 to allow improved function with moving, sitting and walking   Baseline pain level max 10/10 with aggravating activities   Status New     PT LONG TERM GOAL #4   Title imrpoved LEFS to 50/80 or better by 11/13/2016 indicating improved function with daily tasks/walking/personal care   Baseline LEFS 0/80   Status New     PT LONG TERM GOAL #5   Title patient will be able to walk with least restrictive device safely on level surfaces and stairs by 11/13/2016 to improve function with household and community activities   Baseline walking with rolling walker, WBAT RLE (non weight bearing due to pain)   Status New     Additional Long Term Goals   Additional Long Term Goals Yes     PT LONG TERM GOAL #6   Title patient will be independent with home program for pain control, flexibiltiy, strength for right LE by discharge 11/13/16 in order to continue to improve function with self management   Baseline requires assistance and guidance for all exercises, limited knowledge of appropriate pain control strategies, progression of exercises    Status New               Plan - 08/27/16 1730    Clinical Impression Statement Patient demonstrated improved ROM and soft tissue elasticity following treament. He continues with significant pain in right knee with increased flexion and will require additional physical therapy intervention to achieve full ROM and minimal pain to alow independence with walking and functional tasks.    Rehab Potential Good   PT Frequency 3x / week   PT Duration 12 weeks   PT Treatment/Interventions Electrical Stimulation;Gait training;Moist Heat;Therapeutic activities;Therapeutic exercise;Balance training;Neuromuscular re-education;Patient/family education;Passive range of motion;Scar mobilization;Manual  techniques   PT Next Visit Plan modalities for pain control, muscle spasms, manual soft tissue mobilization, therapeutic exercise for ROM, gait   PT Home Exercise Plan ROM, quad sets every hour, foot intrinsic exercises, pain control, hip/knee flexion with assistance      Patient will benefit from skilled therapeutic intervention in order to improve the following deficits and impairments:  Decreased strength, Decreased balance, Hypomobility, Impaired flexibility, Pain, Impaired perceived functional ability, Decreased activity tolerance, Decreased endurance, Increased muscle spasms, Difficulty walking, Decreased range of motion  Visit Diagnosis: Pain in right leg  Difficulty in walking, not elsewhere classified  Other muscle spasm  Muscle weakness (generalized)     Problem List There are no active problems to display for this patient.   Beacher May PT 08/28/2016, 7:24 PM  [facility]ORTS [facility]IC[facility] [address], [place], [address] Phone: [phone]   Fax:  [phone]  Name: Peter Stanley MRN: [id number] Date of Birth: August 31, 1981

## 2016-08-31 ENCOUNTER — Ambulatory Visit: Payer: Self-pay | Admitting: Physical Therapy

## 2016-08-31 ENCOUNTER — Encounter: Payer: Self-pay | Admitting: Physical Therapy

## 2016-08-31 DIAGNOSIS — M62838 Other muscle spasm: Secondary | ICD-10-CM

## 2016-08-31 DIAGNOSIS — M6281 Muscle weakness (generalized): Secondary | ICD-10-CM

## 2016-08-31 DIAGNOSIS — R262 Difficulty in walking, not elsewhere classified: Secondary | ICD-10-CM

## 2016-08-31 DIAGNOSIS — M79604 Pain in right leg: Secondary | ICD-10-CM

## 2016-08-31 NOTE — Therapy (Signed)
[place] [facility] [address] [place], [address] Phone: [phone]   Fax:  [phone]  Physical Therapy Treatment  Patient Details  Name: Peter Stanley MRN: [id number] Date of Birth: 1981/07/21 Referring Provider: Abram Sander MD  Encounter Date: 08/31/2016      PT End of Session - 08/31/16 1710    Visit Number 4   Number of Visits 36   Date for PT Re-Evaluation 11/13/16   PT Start Time 1617   PT Stop Time 1708   PT Time Calculation (min) 51 min   Activity Tolerance Patient limited by pain;Patient limited by fatigue   Behavior During Therapy [facility] for tasks assessed/performed;Anxious      Past Medical History:  Diagnosis Date  . Allergy   . Anxiety     History reviewed. No pertinent surgical history.  There were no vitals filed for this visit.      Subjective Assessment - 08/31/16 1648    Subjective Patient reports he felt much better following previous session with decreased knee pain and able to sleep better. He is working on [employer] and able to raise leg x 5 reps independently over the weekend one time.    Limitations Sitting;Standing;Walking;House hold activities;Other (comment)   Patient Stated Goals to be able to perform personal care activites and return to normal activites without limitaitons (prior level of function)   Currently in Pain? Yes   Pain Score 5    Pain Location Knee   Pain Orientation Right   Pain Descriptors / Indicators Tightness;Spasm   Pain Type Acute pain;Surgical pain  05/30/2016   Pain Onset More than a month ago   Pain Frequency Constant        Objective:  Gait: ambulating with 2 wheeled walker WBAT right LE, wearing sneaker on right foot and placing foot flat on floor, TDWB Palpation: Decreased soft tissue mobility along right thigh lateral aspect and just proximal to patella, medial hamstring muscle   Treatment:  Manual therapy: STM right quadriceps  and hamstringmusclessuperficial techniques to improve mobility/decrease pain with patient supine partially reclined and right LE on pillow Patellar mobilization with patella mobilizer: distraction with inferior, medial, lateral glides x 3 reps  Therapeutic exercise: patient performed exercises with guidance, verbal and tactile cues and demonstration of PT: AAROM right knee/ankle/hip 2 x 3 reps with patient supine, reclined with knee supported  Supine lying hip/knee flexion, extension with isometric hold at end ranges extension, flexion of knee 3 sets, 3-5 reps Quad setting with and withoutestim.  Modalities:  Electrical stimulation; Russian stim. 10/10 cycle right VMO/quadriceps x .With patient in reclined position with pillow and towel roll under right knee/LEGoal: muscle re education  Patient response to treatment: Patient with improved soft tissue elasticity 50% following manual techniques; decreased pain with knee flexion exercises as compared to previous session. Improved motor control quadriceps following estim., allowing patient to perform SLR with less assistance x 3 reps.         PT Long Term Goals - 08/20/16 1733      PT LONG TERM GOAL #1   Title Patient will improve AAROM right knee up to 90 degrees by 09/16/2016 to allow him to sit and walk with more normal gait pattern and to allow decreased pain in knee   Baseline AAROM right knee 0-10 degrees flexion with pain   Status New     PT LONG TERM GOAL #2   Title patient will improve LEFS to  25/80 by 09/20/16 to demonstrate improving function with daily activities    Baseline LEFS 0/80   Status New     PT LONG TERM GOAL #3   Title patient will have pain level max of 6/10 by 10/20/2016 to allow improved function with moving, sitting and walking   Baseline pain level max 10/10 with aggravating activities   Status New     PT LONG TERM GOAL #4   Title imrpoved LEFS to 50/80 or better by 11/13/2016 indicating  improved function with daily tasks/walking/personal care   Baseline LEFS 0/80   Status New     PT LONG TERM GOAL #5   Title patient will be able to walk with least restrictive device safely on level surfaces and stairs by 11/13/2016 to improve function with household and community activities   Baseline walking with rolling walker, WBAT RLE (non weight bearing due to pain)   Status New     Additional Long Term Goals   Additional Long Term Goals Yes     PT LONG TERM GOAL #6   Title patient will be independent with home program for pain control, flexibiltiy, strength for right LE by discharge 11/13/16 in order to continue to improve function with self management   Baseline requires assistance and guidance for all exercises, limited knowledge of appropriate pain control strategies, progression of exercises    Status New               Plan - 08/31/16 1711    Clinical Impression Statement Patient with improving ROM and soft tissue elasticity, improved ability to perform exercises for ROM right knee flexion and extension with less difficulty/pain. He continues with severe decreased ROM in right knee and weakness that limits function with walking and personal care.    Rehab Potential Good   PT Frequency 3x / week   PT Duration 12 weeks   PT Treatment/Interventions Electrical Stimulation;Gait training;Moist Heat;Therapeutic activities;Therapeutic exercise;Balance training;Neuromuscular re-education;Patient/family education;Passive range of motion;Scar mobilization;Manual techniques   PT Next Visit Plan modalities for pain control, muscle spasms, manual soft tissue mobilization, therapeutic exercise for ROM, gait   PT Home Exercise Plan ROM, quad sets every hour, foot intrinsic exercises, pain control, hip/knee flexion with assistance      Patient will benefit from skilled therapeutic intervention in order to improve the following deficits and impairments:  Decreased strength, Decreased  balance, Hypomobility, Impaired flexibility, Pain, Impaired perceived functional ability, Decreased activity tolerance, Decreased endurance, Increased muscle spasms, Difficulty walking, Decreased range of motion  Visit Diagnosis: Pain in right leg  Difficulty in walking, not elsewhere classified  Other muscle spasm  Muscle weakness (generalized)     Problem List There are no active problems to display for this patient.   Beacher MayBrooks, Allina Riches PT 08/31/2016, 10:25 PM   [facility] [address] [place], [place], [address] Phone: [phone]   Fax:  [phone]  Name: Peter BrunsMatthew T Stanley MRN: [id number] Date of Birth: 01-12-1981

## 2016-09-01 ENCOUNTER — Ambulatory Visit: Payer: Self-pay | Admitting: Physical Therapy

## 2016-09-01 ENCOUNTER — Encounter: Payer: Self-pay | Admitting: Physical Therapy

## 2016-09-01 DIAGNOSIS — M79604 Pain in right leg: Secondary | ICD-10-CM

## 2016-09-01 DIAGNOSIS — M62838 Other muscle spasm: Secondary | ICD-10-CM

## 2016-09-01 DIAGNOSIS — M6281 Muscle weakness (generalized): Secondary | ICD-10-CM

## 2016-09-01 DIAGNOSIS — R262 Difficulty in walking, not elsewhere classified: Secondary | ICD-10-CM

## 2016-09-01 NOTE — Therapy (Signed)
[place] [facility] [address] [place], [address] Phone: [phone]   Fax:  [phone]  Physical Therapy Treatment  Patient Details  Name: Peter Stanley MRN: [id number] Date of Birth: [birth year] Referring Provider: Abram Sander MD  Encounter Date: 09/01/2016      PT End of Session - 09/01/16 1900    Visit Number 5   Number of Visits 36   Date for PT Re-Evaluation 11/13/16   PT Start Time 1805   PT Stop Time 1855   PT Time Calculation (min) 50 min   Activity Tolerance Patient limited by pain;Patient limited by fatigue   Behavior During Therapy [facility] for tasks assessed/performed;Anxious      Past Medical History:  Diagnosis Date  . Allergy   . Anxiety     History reviewed. No pertinent surgical history.  There were no vitals filed for this visit.      Subjective Assessment - 09/01/16 1830    Subjective Patient reports he is feeling looser in his right knee with therapy and is able to sleep better now. HE reports being sore following previous therapy session. He states he has pain with placing left foot on floor with trying to put weight through it.    Limitations Sitting;Standing;Walking;House hold activities;Other (comment)   Patient Stated Goals to be able to perform personal care activites and return to normal activites without limitaitons (prior level of function)   Currently in Pain? Yes   Pain Score 5    Pain Location Knee   Pain Orientation Right   Pain Descriptors / Indicators Tightness;Spasm   Pain Type Acute pain;Surgical pain  05/30/2016   Pain Onset More than a month ago   Pain Frequency Constant      Objective:  Gait: ambulating with 2 wheeled walker WBAT right LE, wearing sneaker on right foot and placing foot flat on floor, TDWB Palpation: Decreased soft tissue mobility along right thigh lateral aspect and just proximal to patella, medial hamstring muscle  AAROM: left  knee flexion 0-30 pre treatment (pain limits further motion)  Treatment:  Manual therapy: STM right quadriceps and hamstringmusclessuperficial techniques to improve mobility/decrease pain with patient supine partially reclined and right LE on pillow Patellar mobilization with patella mobilizer: distraction with inferior, medial, lateral glides x 3 reps  Therapeutic exercise: patient performed exercises with guidance, verbal and tactile cues and demonstration of PT: AAROM right knee/ankle/hip 2 x 3 reps with patient supine, reclined with knee supported  Supine lying hip/knee flexion, extension with isometric hold at end ranges extension, flexion of knee 3 sets, 3-5 reps Hip abduction with assistance with isometric hold end range x 10 reps Sitting assisted knee flexion through partial ROM ~ 40/45 degrees with pain and stiffness noted, 3-5 reps as tolerated Quad setting with and withoutestim.  Modalities:  Electrical stimulation; Russian stim. 10/10 cycle right VMO/quadriceps, high volt estim. Clinical protocol for muscle spasms with electrodes applied to lateral thigh (proximal) and distal medial hamstring muscles,intensity to tolerance   x .With patient in reclined position with pillow and towel roll under right knee/LEGoal: muscle re education, pain control, reduce spasms, improve ROM  Patient response to treatment: Patient demonstrated improved ROM by 10 degrees flexion in right knee with pain limiting motion, improved strength noted with all isometric exercises with repetition, improved motor control with estim. And improved soft tissue elasticity at least 30% with decreased pain with STM, ROM exercises with increased flexibility with treatment.  PT Education - 09/01/16 1840    Education provided Yes   Education Details HEP: work on knee flexion in sitting, knee extension    Person(s) Educated Patient   Methods Explanation;Demonstration   Comprehension  Verbalized understanding             PT Long Term Goals - 08/20/16 1733      PT LONG TERM GOAL #1   Title Patient will improve AAROM right knee up to 90 degrees by 09/16/2016 to allow him to sit and walk with more normal gait pattern and to allow decreased pain in knee   Baseline AAROM right knee 0-10 degrees flexion with pain   Status New     PT LONG TERM GOAL #2   Title patient will improve LEFS to 25/80 by 09/20/16 to demonstrate improving function with daily activities    Baseline LEFS 0/80   Status New     PT LONG TERM GOAL #3   Title patient will have pain level max of 6/10 by 10/20/2016 to allow improved function with moving, sitting and walking   Baseline pain level max 10/10 with aggravating activities   Status New     PT LONG TERM GOAL #4   Title imrpoved LEFS to 50/80 or better by 11/13/2016 indicating improved function with daily tasks/walking/personal care   Baseline LEFS 0/80   Status New     PT LONG TERM GOAL #5   Title patient will be able to walk with least restrictive device safely on level surfaces and stairs by 11/13/2016 to improve function with household and community activities   Baseline walking with rolling walker, WBAT RLE (non weight bearing due to pain)   Status New     Additional Long Term Goals   Additional Long Term Goals Yes     PT LONG TERM GOAL #6   Title patient will be independent with home program for pain control, flexibiltiy, strength for right LE by discharge 11/13/16 in order to continue to improve function with self management   Baseline requires assistance and guidance for all exercises, limited knowledge of appropriate pain control strategies, progression of exercises    Status New               Plan - 09/01/16 1856    Clinical Impression Statement Patient demonstrates improvement with decreased pain and increased flexibility in right knee with treament with gain of 30 degrees of flexion over the past week. He continues  with pain limitiing further motion with flexion and weakness and pain in right LE limiting ability to walk without difficulty or use of walker.    Rehab Potential Good   PT Frequency 3x / week   PT Duration 12 weeks   PT Treatment/Interventions Electrical Stimulation;Gait training;Moist Heat;Therapeutic activities;Therapeutic exercise;Balance training;Neuromuscular re-education;Patient/family education;Passive range of motion;Scar mobilization;Manual techniques   PT Next Visit Plan modalities for pain control, muscle spasms, manual soft tissue mobilization, therapeutic exercise for ROM, gait   PT Home Exercise Plan ROM, quad sets every hour, foot intrinsic exercises, pain control, hip/knee flexion with assistance      Patient will benefit from skilled therapeutic intervention in order to improve the following deficits and impairments:  Decreased strength, Decreased balance, Hypomobility, Impaired flexibility, Pain, Impaired perceived functional ability, Decreased activity tolerance, Decreased endurance, Increased muscle spasms, Difficulty walking, Decreased range of motion  Visit Diagnosis: Pain in right leg  Difficulty in walking, not elsewhere classified  Other muscle spasm  Muscle weakness (generalized)  Problem List There are no active problems to display for this patient.   Beacher May PT 09/02/2016, 5:21 PM  [place] [facility] PHYSICAL AND SPORTS MEDICINE [address], [place], [address] Phone: [phone]   Fax:  [phone]  Name: ORYON GARY MRN: [id number] Date of Birth: January 07, 1981

## 2016-09-02 ENCOUNTER — Ambulatory Visit: Payer: Self-pay | Admitting: Physical Therapy

## 2016-09-03 ENCOUNTER — Encounter: Payer: Self-pay | Admitting: Physical Therapy

## 2016-09-03 ENCOUNTER — Ambulatory Visit: Payer: Self-pay | Admitting: Physical Therapy

## 2016-09-03 DIAGNOSIS — M79604 Pain in right leg: Secondary | ICD-10-CM

## 2016-09-03 DIAGNOSIS — R262 Difficulty in walking, not elsewhere classified: Secondary | ICD-10-CM

## 2016-09-03 DIAGNOSIS — M62838 Other muscle spasm: Secondary | ICD-10-CM

## 2016-09-03 DIAGNOSIS — M6281 Muscle weakness (generalized): Secondary | ICD-10-CM

## 2016-09-03 NOTE — Therapy (Signed)
[facility] [facility] [address] [place], [place], [address] Phone: [phone]   Fax:  [phone]  Physical Therapy Treatment  Patient Details  Name: Peter Stanley MRN: [id number] Date of Birth: [birth year] Referring Provider: Abram Sanderpatterson, jennifer MD  Encounter Date: 09/03/2016      PT End of Session - 09/03/16 1752    Visit Number 6   Number of Visits 36   Date for PT Re-Evaluation 11/13/16   PT Start Time 1720   PT Stop Time 1812   PT Time Calculation (min) 52 min   Activity Tolerance Patient limited by pain;Patient limited by fatigue   Behavior [facility] for [facility] assessed/performed;Anxious      Past Medical History:  Diagnosis Date  . Allergy   . Anxiety     History reviewed. No pertinent surgical history.  There were no vitals filed for this visit.      Subjective Assessment - 09/03/16 1725    Subjective Increased pain in right knee following treatment last session. He is unable to control pain with OTC medication.    Limitations Sitting;Standing;Walking;House hold activities;Other (comment)   Patient Stated Goals to be able to perform personal care activites and return to normal activites without limitaitons (prior level of function)   Currently in Pain? Yes   Pain Score 8    Pain Location Knee   Pain Orientation Right   Pain Descriptors / Indicators Aching;Pressure;Tightness   Pain Type Acute pain;Surgical pain  05/30/2016   Pain Onset More than a month ago   Pain Frequency Constant      Objective:  Gait: ambulating with 2 wheeled walker NWB right LE, wearing sneaker on right foot  Palpation: Decreased soft tissue mobility along right thigh lateral aspect and just proximal to patella, medial hamstring muscle AAROM: left knee flexion 0-30 pre/post treatment (pain limits further motion)  Treatment:  Manual therapy: STM right quadriceps and hamstringmusclessuperficial techniques  to improve mobility/decrease pain with patient supine partially reclined and right LE on pillow Patellar mobilization with patella mobilizer: distraction with inferior, medial, lateral glides x 3 reps  Modalities:  Electrical stimulation; Russian stim. 10/10 cycle right VMO/quadriceps, high volt estim. Clinical protocol for muscle spasms with electrodes applied to lateral thigh (proximal) and distal medial hamstring muscles,intensity to tolerance   x 20min.With patient in reclined position with pillow and towel roll under right knee/LE; Moist heat applied to right knee, hamstring and right lower back QL region in conjunction with estim. (no adverse effects noted) Goal: muscle re education, pain control, reduce spasms, improve ROM  Patient response to treatment: Patient pain level decreased form 8/10 to 3/10 right knee and improved soft tissue elasticity in right quadriceps and hamstring by 50%. ROM remained markedly limited due to pain level. Did not tolerate much exercises today, concentrated on pain control. Soft tissue mobility           PT Education - 09/03/16 1727    Education provided Yes   Education Details HEP: continue with exericses as instructed   Person(s) Educated Patient   Methods Explanation;Demonstration   Comprehension Verbalized understanding             PT Long Term Goals - 08/20/16 1733      PT LONG TERM GOAL #1   Title Patient will improve AAROM right knee up to 90 degrees by 09/16/2016 to allow him to sit and walk with more normal gait pattern and to allow decreased pain  in knee   Baseline AAROM right knee 0-10 degrees flexion with pain   Status New     PT LONG TERM GOAL #2   Title patient will improve LEFS to 25/80 by 09/20/16 to demonstrate improving function with daily activities    Baseline LEFS 0/80   Status New     PT LONG TERM GOAL #3   Title patient will have pain level max of 6/10 by 10/20/2016 to allow improved function with moving,  sitting and walking   Baseline pain level max 10/10 with aggravating activities   Status New     PT LONG TERM GOAL #4   Title imrpoved LEFS to 50/80 or better by 11/13/2016 indicating improved function with daily tasks/walking/personal care   Baseline LEFS 0/80   Status New     PT LONG TERM GOAL #5   Title patient will be able to walk with least restrictive device safely on level surfaces and stairs by 11/13/2016 to improve function with household and community activities   Baseline walking with rolling walker, WBAT RLE (non weight bearing due to pain)   Status New     Additional Long Term Goals   Additional Long Term Goals Yes     PT LONG TERM GOAL #6   Title patient will be independent with home program for pain control, flexibiltiy, strength for right LE by discharge 11/13/16 in order to continue to improve function with self management   Baseline requires assistance and guidance for all exercises, limited knowledge of appropriate pain control strategies, progression of exercises    Status New               Plan - 09/03/16 1813    Clinical Impression Statement Patient demonstrates slow progress due to continued increased pain and spasms with decreased joint and soft tissue mobility. He is having difficulty controlling pain and therefore is not exercising as much at home.    Rehab Potential Good   PT Frequency 3x / week   PT Duration 12 weeks   PT Treatment/Interventions Electrical Stimulation;Gait training;Moist Heat;Therapeutic activities;Therapeutic exercise;Balance training;Neuromuscular re-education;Patient/family education;Passive range of motion;Scar mobilization;Manual techniques   PT Next Visit Plan modalities for pain control, muscle spasms, manual soft tissue mobilization, therapeutic exercise for ROM, gait   PT Home Exercise Plan ROM, quad sets every hour, foot intrinsic exercises, pain control, hip/knee flexion with assistance      Patient will benefit from  skilled therapeutic intervention in order to improve the following deficits and impairments:  Decreased strength, Decreased balance, Hypomobility, Impaired flexibility, Pain, Impaired perceived functional ability, Decreased activity tolerance, Decreased endurance, Increased muscle spasms, Difficulty walking, Decreased range of motion  Visit Diagnosis: Pain in right leg  Difficulty in walking, not elsewhere classified  Other muscle spasm  Muscle weakness (generalized)     Problem List There are no active problems to display for this patient.   Beacher May PT 09/04/2016, 6:00 PM  [place] [facility] PHYSICAL AND SPORTS MEDICINE [address], [place], [address] Phone: [phone]   Fax:  [phone]  Name: Peter Stanley MRN: [id number] Date of Birth: 1981-01-18

## 2016-09-07 ENCOUNTER — Encounter: Payer: Self-pay | Admitting: Physical Therapy

## 2016-09-07 ENCOUNTER — Ambulatory Visit: Payer: Self-pay | Admitting: Physical Therapy

## 2016-09-07 DIAGNOSIS — M79604 Pain in right leg: Secondary | ICD-10-CM

## 2016-09-07 DIAGNOSIS — M6281 Muscle weakness (generalized): Secondary | ICD-10-CM

## 2016-09-07 DIAGNOSIS — R262 Difficulty in walking, not elsewhere classified: Secondary | ICD-10-CM

## 2016-09-07 DIAGNOSIS — M62838 Other muscle spasm: Secondary | ICD-10-CM

## 2016-09-07 NOTE — Therapy (Signed)
[place] [facility] [address], [place], [address] Phone: [phone]   Fax:  [phone]  Physical Therapy Treatment  Patient Details  Name: Peter Stanley MRN: [id number] Date of Birth: 1981/09/21 Referring Provider: Abram Sander MD  Encounter Date: 09/07/2016      PT End of Session - 09/07/16 1943    Visit Number 7   Number of Visits 36   Date for PT Re-Evaluation 11/13/16   PT Start Time 1858   PT Stop Time 1941   PT Time Calculation (min) 43 min   Activity Tolerance Patient limited by pain;Patient limited by fatigue   Behavior During Therapy [facility] for tasks assessed/performed;Anxious      Past Medical History:  Diagnosis Date  . Allergy   . Anxiety     History reviewed. No pertinent surgical history.  There were no vitals filed for this visit.      Subjective Assessment - 09/07/16 1935    Subjective Increased pain in right knee following treatment sessions. He is unable to control pain with OTC medication and would like to have some kind of alternate medication to assist with pain control so he can exercise.    Limitations Sitting;Standing;Walking;House hold activities;Other (comment)   Currently in Pain? Yes   Pain Score 7    Pain Location Knee   Pain Orientation Right   Pain Descriptors / Indicators Aching;Tightness;Pressure   Pain Type Acute pain;Surgical pain  05/30/2016   Pain Onset More than a month ago   Pain Frequency Constant      Objective:  Gait: ambulating with 2 wheeled walker WBAT RLE Palpation: Decreased soft tissue mobility along right thigh lateral thigh close to right hip and just proximal to patella, medial hamstring muscle   Treatment:  Manual therapy: STM right quadriceps and hamstring muscles and along proximal lateral thigh and medial/lateral aspect of right knee AAROM: left knee flexion 0-35 pre/post treatment (pain limits further merficial techniques  to improve mobility/decrease pain with patient supine partially reclined and right LE on pillow Patellar mobilization with patella mobilizer: distraction with inferior, medial, lateral glides x 3 reps  Therapeutic exercise: patient performed with assistance, verbal and tactile cues of therapist: AAROM: right LE knee flexion/extension with graduated motion at tolerated multiple sets with patient partially reclined  Modalities:  Electrical stimulation; Russian stim. 10/10 cycle right VMO/quadriceps, high volt estim. Clinical protocol for muscle spasms with electrodes applied to lateral thigh (proximal) and distal medial hamstring muscles,intensity to tolerance  x .With patient in reclined position with pillow and towel roll under right knee/LE; Moist heat applied to right knee, hamstring in conjunction with estim. (no adverse effects noted) Goal: muscle re education, pain control, reduce spasms, improve ROM  Patient response to treatment: Patient demonstrated improved soft tissue elasticity which allowed improved flexion of right knee through available ROM, He is able to place increased weight on right LE with ambulation with less difficulty. Patient with decreased pain from   7/10 to  4/10. Patient with decreased spasms by 50% following STM. Improved motor control with repetition and cuing, following estim.           PT Education - 09/07/16 1946    Education provided Yes   Education Details HEP: continue with foot, knee and hip exercises for ROM and strength and motor control   Person(s) Educated Patient   Methods Explanation;Demonstration;Verbal cues   Comprehension Verbalized understanding;Returned demonstration;Verbal cues required  PT Long Term Goals - 08/20/16 1733      PT LONG TERM GOAL #1   Title Patient will improve AAROM right knee up to 90 degrees by 09/16/2016 to allow him to sit and walk with more normal gait pattern and to allow decreased pain  in knee   Baseline AAROM right knee 0-10 degrees flexion with pain   Status New     PT LONG TERM GOAL #2   Title patient will improve LEFS to 25/80 by 09/20/16 to demonstrate improving function with daily activities    Baseline LEFS 0/80   Status New     PT LONG TERM GOAL #3   Title patient will have pain level max of 6/10 by 10/20/2016 to allow improved function with moving, sitting and walking   Baseline pain level max 10/10 with aggravating activities   Status New     PT LONG TERM GOAL #4   Title imrpoved LEFS to 50/80 or better by 11/13/2016 indicating improved function with daily tasks/walking/personal care   Baseline LEFS 0/80   Status New     PT LONG TERM GOAL #5   Title patient will be able to walk with least restrictive device safely on level surfaces and stairs by 11/13/2016 to improve function with household and community activities   Baseline walking with rolling walker, WBAT RLE (non weight bearing due to pain)   Status New     Additional Long Term Goals   Additional Long Term Goals Yes     PT LONG TERM GOAL #6   Title patient will be independent with home program for pain control, flexibiltiy, strength for right LE by discharge 11/13/16 in order to continue to improve function with self management   Baseline requires assistance and guidance for all exercises, limited knowledge of appropriate pain control strategies, progression of exercises    Status New               Plan - 09/07/16 1944    Clinical Impression Statement Patient demonstrates slow progress due to moderate to severe pain that limits abiltiy to perform knee flexion activities for any extended period of time. He is progressing with improving soft tissue elasticity that allows improvement with knee flexion however there is fair carry over due to pain not well controlled at home.  He is seeing MD and will discuss other alternatives for controlling pain so he may continue to improve and return to  prior level of function.   Rehab Potential Good   PT Frequency 3x / week   PT Duration 12 weeks   PT Treatment/Interventions Electrical Stimulation;Gait training;Moist Heat;Therapeutic activities;Therapeutic exercise;Balance training;Neuromuscular re-education;Patient/family education;Passive range of motion;Scar mobilization;Manual techniques   PT Next Visit Plan modalities for pain control, muscle spasms, manual soft tissue mobilization, therapeutic exercise for ROM, gait   PT Home Exercise Plan ROM, quad sets every hour, foot intrinsic exercises, pain control, hip/knee flexion with assistance      Patient will benefit from skilled therapeutic intervention in order to improve the following deficits and impairments:  Decreased strength, Decreased balance, Hypomobility, Impaired flexibility, Pain, Impaired perceived functional ability, Decreased activity tolerance, Decreased endurance, Increased muscle spasms, Difficulty walking, Decreased range of motion  Visit Diagnosis: Pain in right leg  Difficulty in walking, not elsewhere classified  Other muscle spasm  Muscle weakness (generalized)     Problem List There are no active problems to display for this patient.   Beacher MayBrooks, Marie PT 09/08/2016, 2:18 PM  [place]  [facility] [address], [place], [address] Phone: [phone]   Fax:  [phone]  Name: Peter Stanley MRN: [id number] Date of Birth: [birth year]

## 2016-09-08 ENCOUNTER — Ambulatory Visit: Payer: Self-pay | Admitting: Physical Therapy

## 2016-09-09 ENCOUNTER — Encounter: Payer: Self-pay | Admitting: Physical Therapy

## 2016-09-10 ENCOUNTER — Encounter: Payer: Self-pay | Admitting: Physical Therapy

## 2016-09-10 ENCOUNTER — Ambulatory Visit: Payer: Self-pay | Admitting: Physical Therapy

## 2016-09-10 DIAGNOSIS — R262 Difficulty in walking, not elsewhere classified: Secondary | ICD-10-CM

## 2016-09-10 DIAGNOSIS — M62838 Other muscle spasm: Secondary | ICD-10-CM

## 2016-09-10 DIAGNOSIS — M6281 Muscle weakness (generalized): Secondary | ICD-10-CM

## 2016-09-10 DIAGNOSIS — M79604 Pain in right leg: Secondary | ICD-10-CM

## 2016-09-11 NOTE — Therapy (Signed)
[facility] PHYS[facility] AND SPORTS MEDICINE [address] [place], [address] Phone: [phone]   Fax:  [phone]  Physical Therapy Treatment  Patient Details  Name: Peter Stanley MRN: [id number] Date of Birth: 1981/05/21 Referring Provider: Abram Sander MD  Encounter Date: 09/10/2016      PT End of Session - 09/10/16 1520    Visit Number 8   Number of Visits 36   Date for PT Re-Evaluation 11/13/16   PT Start Time 1630   PT Stop Time 1710   PT Time Calculation (min) 40 min   Activity Tolerance Patient limited by pain;Patient limited by fatigue   Behavior During Therapy [facility] for tasks assessed/performed;Anxious      Past Medical History:  Diagnosis Date  . Allergy   . Anxiety     History reviewed. No pertinent surgical history.  There were no vitals filed for this visit.      Subjective Assessment - 09/10/16 1652    Subjective Pain and stiffness in right knee, able to walk with walker into MD office the other day and was exhausted after getting home.     Limitations Sitting;Standing;Walking;House hold activities;Other (comment)   Patient Stated Goals to be able to perform personal care activites and return to normal activites without limitaitons (prior level of function)   Currently in Pain? Yes   Pain Score 7    Pain Location Knee   Pain Orientation Right   Pain Descriptors / Indicators Aching;Tightness;Spasm   Pain Type Acute pain;Surgical pain  05/30/2016   Pain Onset More than a month ago   Pain Frequency Constant      Objective:  Gait: ambulating with 2 wheeled walker WBAT RLE; placing right foot flat on floor Palpation: Decreased soft tissue mobility along right thigh lateral thigh close to right hip and just proximal to patella, medial hamstring muscle and anterior thigh with increased pain on palpation of quadriceps spasms  AAROM: left knee flexion 0-35 pre/posttreatment (pain limits further  motion)  Treatment:  Manual therapy: STM right quadriceps and hamstring muscles and along proximal lateral thigh, anterior thigh and medial/lateral aspect of right knee; goal: to improve mobility/decrease pain with patient supine partially reclined and right LE on pillow Patellar mobilization with patella mobilizer: distraction with inferior, medial, lateral glides x 3 reps  Therapeutic exercise: patient performed with assistance, verbal and tactile cues of therapist: AAROM: right LE knee flexion/extension with graduated motion at tolerated multiple sets with patient partially reclined TKE x 5 reps with hold in extension 2-3 seconds (very difficult and using lots of effort)  Modalities:  Electrical stimulation; Russian stim. 10/10 cycle right VMO/quadriceps, high volt estim. Clinical protocol for muscle spasms with electrodes applied to lateral thigh (proximal) and distal medial hamstring muscles,intensity to tolerance  x .With patient in reclined position with pillow and towel roll under right knee/LE; Moist heat applied to right knee, hamstring in conjunction with estim. (no adverse effects noted) Goal: muscle re education, pain control, reduce spasms, improve ROM  Patient response to treatment: Patient with improved soft tissue elasticity in quadriceps, hamstring and able to tolerate minimal knee flexion today due to pain level remaining high 5-7/10. Patient verbalized understanding of home program for exercise, assisted by wife and weight shifting exercises.        PT Education - 09/10/16 1715    Education provided Yes   Education Details HEP; instructed to have wife assist with hip/knee flexion right LE, continue use of heat  and begin more AAROM and standing weight shifting side to side as tolerated   Person(s) Educated Patient   Methods Explanation;Demonstration   Comprehension Verbalized understanding             PT Long Term Goals - 08/20/16 1733      PT LONG  TERM GOAL #1   Title Patient will improve AAROM right knee up to 90 degrees by 09/16/2016 to allow him to sit and walk with more normal gait pattern and to allow decreased pain in knee   Baseline AAROM right knee 0-10 degrees flexion with pain   Status New     PT LONG TERM GOAL #2   Title patient will improve LEFS to 25/80 by 09/20/16 to demonstrate improving function with daily activities    Baseline LEFS 0/80   Status New     PT LONG TERM GOAL #3   Title patient will have pain level max of 6/10 by 10/20/2016 to allow improved function with moving, sitting and walking   Baseline pain level max 10/10 with aggravating activities   Status New     PT LONG TERM GOAL #4   Title imrpoved LEFS to 50/80 or better by 11/13/2016 indicating improved function with daily tasks/walking/personal care   Baseline LEFS 0/80   Status New     PT LONG TERM GOAL #5   Title patient will be able to walk with least restrictive device safely on level surfaces and stairs by 11/13/2016 to improve function with household and community activities   Baseline walking with rolling walker, WBAT RLE (non weight bearing due to pain)   Status New     Additional Long Term Goals   Additional Long Term Goals Yes     PT LONG TERM GOAL #6   Title patient will be independent with home program for pain control, flexibiltiy, strength for right LE by discharge 11/13/16 in order to continue to improve function with self management   Baseline requires assistance and guidance for all exercises, limited knowledge of appropriate pain control strategies, progression of exercises    Status New               Plan - 09/10/16 1720    Clinical Impression Statement Patient is now progressing with weight bearing at home and will be able to begin gait activities including weight shifting activieis. He continues with significant decreased ROM with severe pain and severe decreased strength right LE and will require continued physical  therapy intervention to address limitations in order to return to PLOF.   Rehab Potential Good   PT Frequency 3x / week   PT Duration 12 weeks   PT Treatment/Interventions Electrical Stimulation;Gait training;Moist Heat;Therapeutic activities;Therapeutic exercise;Balance training;Neuromuscular re-education;Patient/family education;Passive range of motion;Scar mobilization;Manual techniques   PT Next Visit Plan modalities for pain control, muscle spasms, manual soft tissue mobilization, therapeutic exercise for ROM, gait   PT Home Exercise Plan ROM, quad sets every hour, foot intrinsic exercises, pain control, hip/knee flexion with assistance, weight shifting side to side in standing      Patient will benefit from skilled therapeutic intervention in order to improve the following deficits and impairments:  Decreased strength, Decreased balance, Hypomobility, Impaired flexibility, Pain, Impaired perceived functional ability, Decreased activity tolerance, Decreased endurance, Increased muscle spasms, Difficulty walking, Decreased range of motion  Visit Diagnosis: Pain in right leg  Difficulty in walking, not elsewhere classified  Other muscle spasm  Muscle weakness (generalized)     Problem List There are  no active problems to display for this patient.   Beacher May PT 09/11/2016, 1:32 PM   [facility] PHYSICAL AND SPORTS MEDICINE [address], [place], [address] Phone: [phone]   Fax:  [phone]  Name: HELTON OLESON MRN: [id number] Date of Birth: [birth year]

## 2016-09-14 ENCOUNTER — Encounter: Payer: Self-pay | Admitting: Physical Therapy

## 2016-09-14 ENCOUNTER — Ambulatory Visit: Payer: Self-pay | Admitting: Physical Therapy

## 2016-09-14 DIAGNOSIS — M79604 Pain in right leg: Secondary | ICD-10-CM

## 2016-09-14 DIAGNOSIS — M62838 Other muscle spasm: Secondary | ICD-10-CM

## 2016-09-14 DIAGNOSIS — M6281 Muscle weakness (generalized): Secondary | ICD-10-CM

## 2016-09-14 DIAGNOSIS — R262 Difficulty in walking, not elsewhere classified: Secondary | ICD-10-CM

## 2016-09-15 ENCOUNTER — Encounter: Payer: Self-pay | Admitting: Physical Therapy

## 2016-09-15 ENCOUNTER — Ambulatory Visit: Payer: Self-pay | Admitting: Physical Therapy

## 2016-09-15 DIAGNOSIS — M62838 Other muscle spasm: Secondary | ICD-10-CM

## 2016-09-15 DIAGNOSIS — R262 Difficulty in walking, not elsewhere classified: Secondary | ICD-10-CM

## 2016-09-15 DIAGNOSIS — M25661 Stiffness of right knee, not elsewhere classified: Secondary | ICD-10-CM

## 2016-09-15 DIAGNOSIS — M79604 Pain in right leg: Secondary | ICD-10-CM

## 2016-09-15 DIAGNOSIS — M6281 Muscle weakness (generalized): Secondary | ICD-10-CM

## 2016-09-15 NOTE — Therapy (Signed)
[facility] [address] [place], [place], [address] Phone: [phone]   Fax:  [phone]  Physical Therapy Treatment  Patient Details  Name: Fabienne BrunsMatthew T Jicha MRN: [id number] Date of Birth: 10/11/1981 Referring Provider: Abram Sanderpatterson, jennifer MD  Encounter Date: 09/14/2016      PT End of Session - 09/14/16 1640    Visit Number 9   Number of Visits 36   Date for PT Re-Evaluation 11/13/16   PT Start Time 1625   PT Stop Time 1708   PT Time Calculation (min) 43 min   Activity Tolerance Patient limited by pain;Patient limited by fatigue   Behavior [facility] assessed/performed;Anxious      Past Medical History:  Diagnosis Date  . Allergy   . Anxiety     History reviewed. No pertinent surgical history.  There were no vitals filed for this visit.      Subjective Assessment - 09/14/16 1637    Subjective Pain/stiffness in right knee. Now getting more feeling in right foot and able to put more weight on his leg with walking and standing.    Limitations Sitting;Standing;Walking;House hold activities;Other (comment)   Patient Stated Goals to be able to perform personal care activites and return to normal activites without limitaitons (prior level of function)   Currently in Pain? Yes   Pain Score 5    Pain Location Shoulder   Pain Orientation Right   Pain Descriptors / Indicators Aching;Tightness   Pain Type Acute pain;Surgical pain  05/30/2016   Pain Onset More than a month ago   Pain Frequency Constant        Objective:  Gait: ambulating with 2 wheeled walker WBAT RLE; improved weight bearing right LE noted Palpation: Decreased soft tissue mobility along right thigh lateral thigh close to right hip, medial hamstring muscle and anterior thigh with increased pain on palpation of quadriceps spasms  AAROM: right knee flexion 0-30 Strength/motor control: improved quadriceps  contraction/setting   Treatment:  Manual therapy: STM right quadriceps and hamstring muscles and along proximal lateral thigh, anterior thigh and medial/lateral aspect of right knee; goal: to improve mobility/decrease pain with patient supine partially reclined and right LE on pillow Patellar mobilization with patella mobilizer: distraction with inferior, medial, lateral glides x 3 reps  Therapeutic exercise: patient performed with assistance, verbal and tactile cues of therapist: AAROM: right LE knee flexion/extension with graduated motion at tolerated multiple sets with patient partially reclined TKE x 5 reps with hold in extension 2-3 seconds (very difficult and using lots of effort) Ankle DF, PF,  Eversion with yellow resistive band x 10 reps each   Modalities:  Electrical stimulation; Russian stim. 10/10 cycle right VMO/quadriceps, high volt estim. Clinical protocol for muscle spasms with electrodes applied to lateral thigh (proximal) and distal medial hamstring muscles,intensity to tolerance  x 20min.With patient in reclined position with pillow and towel roll under right knee/LE; Moist heat applied to right knee, hamstring in conjunction with estim. (no adverse effects noted) Goal: muscle re education, pain control, reduce spasms, improve ROM  Patient response to treatment: Patient demonstrated improved technique with exercises with guidance, repetition.  Patient with decreased spasms by 30% following STM,decreased tolerance due to pain. Improved motor control with repetition and cuing, following estim.        PT Education - 09/14/16 1639    Education provided Yes   Education Details HEP: instructed in ankle resistive band exercises for DF/PF and eversion  Person(s) Educated Patient   Methods Explanation;Demonstration   Comprehension Verbalized understanding;Returned demonstration;Verbal cues required             PT Long Term Goals - 08/20/16 1733      PT LONG  TERM GOAL #1   Title Patient will improve AAROM right knee up to 90 degrees by 09/16/2016 to allow him to sit and walk with more normal gait pattern and to allow decreased pain in knee   Baseline AAROM right knee 0-10 degrees flexion with pain   Status New     PT LONG TERM GOAL #2   Title patient will improve LEFS to 25/80 by 09/20/16 to demonstrate improving function with daily activities    Baseline LEFS 0/80   Status New     PT LONG TERM GOAL #3   Title patient will have pain level max of 6/10 by 10/20/2016 to allow improved function with moving, sitting and walking   Baseline pain level max 10/10 with aggravating activities   Status New     PT LONG TERM GOAL #4   Title imrpoved LEFS to 50/80 or better by 11/13/2016 indicating improved function with daily tasks/walking/personal care   Baseline LEFS 0/80   Status New     PT LONG TERM GOAL #5   Title patient will be able to walk with least restrictive device safely on level surfaces and stairs by 11/13/2016 to improve function with household and community activities   Baseline walking with rolling walker, WBAT RLE (non weight bearing due to pain)   Status New     Additional Long Term Goals   Additional Long Term Goals Yes     PT LONG TERM GOAL #6   Title patient will be independent with home program for pain control, flexibiltiy, strength for right LE by discharge 11/13/16 in order to continue to improve function with self management   Baseline requires assistance and guidance for all exercises, limited knowledge of appropriate pain control strategies, progression of exercises    Status New               Plan - 09/14/16 1641    Clinical Impression Statement Patient progressing slowly due to significant pain and spasms in right LE that limit abiltiy to perform exercises. He is well motivated and eager to progress to standing and increased flexion exercises.    Rehab Potential Good   PT Frequency 3x / week   PT Duration  12 weeks   PT Treatment/Interventions Electrical Stimulation;Gait training;Moist Heat;Therapeutic activities;Therapeutic exercise;Balance training;Neuromuscular re-education;Patient/family education;Passive range of motion;Scar mobilization;Manual techniques   PT Next Visit Plan modalities for pain control, muscle spasms, manual soft tissue mobilization, therapeutic exercise for ROM, gait; NuStep; standing sway on balance pad   PT Home Exercise Plan ROM, quad sets every hour, foot intrinsic exercises, pain control, hip/knee flexion with assistance, weight shifting side to side in standing      Patient will benefit from skilled therapeutic intervention in order to improve the following deficits and impairments:  Decreased strength, Decreased balance, Hypomobility, Impaired flexibility, Pain, Impaired perceived functional ability, Decreased activity tolerance, Decreased endurance, Increased muscle spasms, Difficulty walking, Decreased range of motion  Visit Diagnosis: Pain in right leg  Difficulty in walking, not elsewhere classified  Other muscle spasm  Muscle weakness (generalized)     Problem List There are no active problems to display for this patient.   Beacher MayBrooks, Caryl Fate PT 09/15/2016, 2:27 PM  [facility]  [facility]ICINE [address] [place], [address] Phone: [phone]   Fax:  [phone]  Name: EUGENIO DOLLINS MRN: [id number] Date of Birth: June 11, 1981

## 2016-09-15 NOTE — Therapy (Signed)
[place] [facility] [address] [place], [place], [address] Phone: [phone]   Fax:  [phone]  Physical Therapy Treatment  Patient Details  Name: Peter Stanley MRN: [id number] Date of Birth: August 05, 1981 Referring Provider: Abram Sanderpatterson, jennifer MD  Encounter Date: 09/15/2016      PT End of Session - 09/15/16 1900    Visit Number 10   Number of Visits 36   Date for PT Re-Evaluation 11/13/16   PT Start Time 1800   PT Stop Time 1850   PT Time Calculation (min) 50 min   Activity Tolerance Patient limited by pain;Patient limited by fatigue   Behavior [facility] assessed/per[facility]      Past Medical History:  Diagnosis Date  . Allergy   . Anxiety     History reviewed. No pertinent surgical history.  There were no vitals filed for this visit.      Subjective Assessment - 09/15/16 1835    Subjective Patient reports increased pain and soreness, stiffness left knee today. eager to go on Nustep.    Limitations Sitting;Standing;Walking;House hold activities;Other (comment)   Patient Stated Goals to be able to perform personal care activites and return to normal activites without limitaitons (prior level of function)   Currently in Pain? Yes   Pain Score 7    Pain Location Knee   Pain Orientation Right   Pain Descriptors / Indicators Aching;Sore;Spasm;Tightness   Pain Type Acute pain;Surgical pain  05/30/2016   Pain Onset More than a month ago   Pain Frequency Constant      Objective:  Gait: ambulating with 2 wheeled walker WBAT RLE; placing right foot flat on floor Palpation: Decreased soft tissue mobility along right thigh, lateral thigh close to right hipand just proximal to patella,  anterior thigh with increased pain on palpation of quadriceps spasms    Treatment:  Manual therapy: STM right quadriceps, along proximal lateral thigh, anterior thigh  goal: to improve mobility/decrease  pain with patient supine partially reclined and right LE on pillow Patellar mobilization with patella mobilizer: distraction with inferior, medial, lateral glides x 3 reps  Therapeutic exercise: patient performed with assistance, verbal and tactile cues of therapist: AAROM: right LE knee flexion/extension with graduated motion at tolerated multiple sets with patient partially reclined TKE x 5 reps with hold in extension 2-3 seconds (very difficult and using lots of effort) Nustep x 3 min. With assistance of therapist on/off machine and to support right knee to be able to perform knee flexion   Modalities:  Electrical stimulation; Russian stim. 10/10 cycle right VMO/quadriceps, high volt estim. Clinical protocol for muscle spasms with electrodes applied to lateral thigh over spasms, intensity to tolerance  x 20min.With patient in reclined position with pillow under right knee/LE; Moist heat applied to right knee, hamstring in conjunction with estim. (no adverse effects noted) Goal: muscle re education, pain control, reduce spasms, improve ROM  Patient response to treatment: Patient demonstrated improved technique with exercises with moderate assistance to support right LE and moderate VC.  Patient with decreased spasms by 50%  And pain/tenderness by 25%, following STM, moist heat and estim.  Improved motor control with repetition and cuing         PT Education - 09/15/16 1900    Education provided Yes   Education Details HEP: continue with ROM exercises as tolerated throughout the day, use heat for warm up   Person(s) Educated Patient   Methods Explanation  Comprehension Verbalized understanding             PT Long Term Goals - 08/20/16 1733      PT LONG TERM GOAL #1   Title Patient will improve AAROM right knee up to 90 degrees by 09/16/2016 to allow him to sit and walk with more normal gait pattern and to allow decreased pain in knee   Baseline AAROM right knee 0-10  degrees flexion with pain   Status New     PT LONG TERM GOAL #2   Title patient will improve LEFS to 25/80 by 09/20/16 to demonstrate improving function with daily activities    Baseline LEFS 0/80   Status New     PT LONG TERM GOAL #3   Title patient will have pain level max of 6/10 by 10/20/2016 to allow improved function with moving, sitting and walking   Baseline pain level max 10/10 with aggravating activities   Status New     PT LONG TERM GOAL #4   Title imrpoved LEFS to 50/80 or better by 11/13/2016 indicating improved function with daily tasks/walking/personal care   Baseline LEFS 0/80   Status New     PT LONG TERM GOAL #5   Title patient will be able to walk with least restrictive device safely on level surfaces and stairs by 11/13/2016 to improve function with household and community activities   Baseline walking with rolling walker, WBAT RLE (non weight bearing due to pain)   Status New     Additional Long Term Goals   Additional Long Term Goals Yes     PT LONG TERM GOAL #6   Title patient will be independent with home program for pain control, flexibiltiy, strength for right LE by discharge 11/13/16 in order to continue to improve function with self management   Baseline requires assistance and guidance for all exercises, limited knowledge of appropriate pain control strategies, progression of exercises    Status New               Plan - 09/15/16 1910    Clinical Impression Statement Patient able to tolerate NuStep fairly well with increased pain right knee with attempting to increased flexion. He is progressing slowly due to spasms and pain and is demonstrating improvement with decreased spasms and pain in right LE to allow steady improvment with knee flexion and ability to walk with increased weight bearing right LE.    Rehab Potential Good   PT Frequency 3x / week   PT Duration 12 weeks   PT Treatment/Interventions Electrical Stimulation;Gait training;Moist  Heat;Therapeutic activities;Therapeutic exercise;Balance training;Neuromuscular re-education;Patient/family education;Passive range of motion;Scar mobilization;Manual techniques   PT Next Visit Plan modalities for pain control, muscle spasms, manual soft tissue mobilization, therapeutic exercise for ROM, gait   PT Home Exercise Plan ROM, quad sets every hour, foot intrinsic exercises, pain control, hip/knee flexion with assistance, weight shifting side to side in standing      Patient will benefit from skilled therapeutic intervention in order to improve the following deficits and impairments:  Decreased strength, Decreased balance, Hypomobility, Impaired flexibility, Pain, Impaired perceived functional ability, Decreased activity tolerance, Decreased endurance, Increased muscle spasms, Difficulty walking, Decreased range of motion  Visit Diagnosis: Pain in right leg  Difficulty in walking, not elsewhere classified  Other muscle spasm  Muscle weakness (generalized)  Stiffness of right knee, not elsewhere classified     Problem List There are no active problems to display for this patient.   Beacher May  PT 09/15/2016, 10:25 PM  [place] [facility] [address] [place], [place], [address] Phone: [phone]   Fax:  [phone]  Name: Peter BrunsMatthew T Stanley MRN: [id number] Date of Birth: March 04, 1981

## 2016-09-16 ENCOUNTER — Ambulatory Visit: Payer: Self-pay | Admitting: Physical Therapy

## 2016-09-17 ENCOUNTER — Ambulatory Visit: Payer: Self-pay | Attending: Orthopedic Surgery | Admitting: Physical Therapy

## 2016-09-17 ENCOUNTER — Encounter: Payer: Self-pay | Admitting: Physical Therapy

## 2016-09-17 DIAGNOSIS — M62838 Other muscle spasm: Secondary | ICD-10-CM | POA: Insufficient documentation

## 2016-09-17 DIAGNOSIS — M79604 Pain in right leg: Secondary | ICD-10-CM | POA: Insufficient documentation

## 2016-09-17 DIAGNOSIS — R262 Difficulty in walking, not elsewhere classified: Secondary | ICD-10-CM | POA: Insufficient documentation

## 2016-09-17 DIAGNOSIS — M25661 Stiffness of right knee, not elsewhere classified: Secondary | ICD-10-CM | POA: Insufficient documentation

## 2016-09-17 DIAGNOSIS — M6281 Muscle weakness (generalized): Secondary | ICD-10-CM | POA: Insufficient documentation

## 2016-09-17 NOTE — Therapy (Signed)
[facility] [address], [place], [address] Phone: [phone]   Fax:  [phone]  Physical Therapy Treatment  Patient Details  Name: Peter Stanley MRN: [id number] Date of Birth: Apr 11, 1981 Referring Provider: Abram Sander MD  Encounter Date: 09/17/2016      PT End of Session - 09/17/16 2024    Visit Number 11   Number of Visits 36   Date for PT Re-Evaluation 11/13/16   PT Start Time 1625   PT Stop Time 1710   PT Time Calculation (min) 45 min   Activity Tolerance Patient limited by pain;Patient limited by fatigue   Behavior During Therapy [facility] for tasks assessed/performed      Past Medical History:  Diagnosis Date  . Allergy   . Anxiety     History reviewed. No pertinent surgical history.  There were no vitals filed for this visit.      Subjective Assessment - 09/17/16 1700    Subjective Patient reports increased pain, difficulty with sleeping following previous session NuStep ROM exercise.    Limitations Sitting;Standing;Walking;House hold activities;Other (comment)   Patient Stated Goals to be able to perform personal care activites and return to normal activites without limitaitons (prior level of function)   Currently in Pain? Yes   Pain Score 7    Pain Location Knee   Pain Orientation Right   Pain Descriptors / Indicators Aching   Pain Type Acute pain;Surgical pain  05/30/2016   Pain Onset More than a month ago      Objective:  Gait: ambulating with 2 wheeled walker NWB RLE, holding right LE off floor due to pain in leg Palpation: Decreased soft tissue mobility along right thigh, lateral thigh close to right hipand just proximal to patella,  anterior thigh withmoderate pain on palpation of quadriceps spasms    Treatment:  Manual therapy: STM right quadriceps, along proximal lateral thigh, anterior thigh goal: to improve mobility/decrease pain with patient supine  partially reclined and right LE on pillow Patellar mobilization with patella mobilizer: distraction with inferior, medial, lateral glides x 3 reps  Therapeutic exercise: patient performed with assistance, verbal and tactile cues of therapist: AAROM: manual resistive exercises for hip abduction with eccentric control x 10 reps in reclined position Manual resistive exercise right ankle PF/DF x 10 reps each AAROM: right LE knee flexion/extension with graduated motion at tolerated multiple sets with patient partially reclined TKE x 10 reps with hold in extension 2-3 seconds (very difficult and using lots of effort) Nustep x 3 min. With assistance of therapist on/off machine and to support right knee to be able to perform knee flexion   Modalities:  Electrical stimulation; Russian stim. 10/10 cycle right VMO/quadriceps, high volt estim. Clinical protocol for muscle spasms with electrodes applied to lateral thigh over spasms, intensity to tolerance  x .With patient in reclined position with pillow under right knee/LE; Moist heat applied to right knee, hamstring in conjunction with estim. (no adverse effects noted) Goal: muscle re education, pain control, reduce spasms, improve ROM  Patient response to treatment: Patient demonstrated increased effort to perform exercises with moderated assistance for correct alignment and performance. Patient with moderate pain with STM right quads; improved soft tissue elasticity following STM with decreased spasms by 50% Improved motor control with repetition and cuing, following estim. Patient able to tolerate NuStep with less discomfort/pain today         PT Education - 09/17/16 1700    Education  provided Yes   Education Details HEP: work on exercises throughout the day, ROM, quad setting and walking/weight shifting    Person(s) Educated Patient   Methods Explanation   Comprehension Verbalized understanding             PT Long Term Goals -  08/20/16 1733      PT LONG TERM GOAL #1   Title Patient will improve AAROM right knee up to 90 degrees by 09/16/2016 to allow him to sit and walk with more normal gait pattern and to allow decreased pain in knee   Baseline AAROM right knee 0-10 degrees flexion with pain   Status New     PT LONG TERM GOAL #2   Title patient will improve LEFS to 25/80 by 09/20/16 to demonstrate improving function with daily activities    Baseline LEFS 0/80   Status New     PT LONG TERM GOAL #3   Title patient will have pain level max of 6/10 by 10/20/2016 to allow improved function with moving, sitting and walking   Baseline pain level max 10/10 with aggravating activities   Status New     PT LONG TERM GOAL #4   Title imrpoved LEFS to 50/80 or better by 11/13/2016 indicating improved function with daily tasks/walking/personal care   Baseline LEFS 0/80   Status New     PT LONG TERM GOAL #5   Title patient will be able to walk with least restrictive device safely on level surfaces and stairs by 11/13/2016 to improve function with household and community activities   Baseline walking with rolling walker, WBAT RLE (non weight bearing due to pain)   Status New     Additional Long Term Goals   Additional Long Term Goals Yes     PT LONG TERM GOAL #6   Title patient will be independent with home program for pain control, flexibiltiy, strength for right LE by discharge 11/13/16 in order to continue to improve function with self management   Baseline requires assistance and guidance for all exercises, limited knowledge of appropriate pain control strategies, progression of exercises    Status New               Plan - 09/17/16 2025    Clinical Impression Statement Able to perform Nustep with less difficulty and pain, improving strength and endurance with exercises. Requires assistance and cuing for all exercises due to significant weakness, stiffness in right knee/LE. He should continue to progress  with physical therapy intervention as he continues to heal from injury.    Rehab Potential Good   PT Frequency 3x / week   PT Duration 12 weeks   PT Treatment/Interventions Electrical Stimulation;Gait training;Moist Heat;Therapeutic activities;Therapeutic exercise;Balance training;Neuromuscular re-education;Patient/family education;Passive range of motion;Scar mobilization;Manual techniques   PT Next Visit Plan modalities for pain control, muscle spasms, manual soft tissue mobilization, therapeutic exercise for ROM, gait   PT Home Exercise Plan ROM, quad sets every hour, foot intrinsic exercises, pain control, hip/knee flexion with assistance, weight shifting side to side in standing      Patient will benefit from skilled therapeutic intervention in order to improve the following deficits and impairments:  Decreased strength, Decreased balance, Hypomobility, Impaired flexibility, Pain, Impaired perceived functional ability, Decreased activity tolerance, Decreased endurance, Increased muscle spasms, Difficulty walking, Decreased range of motion  Visit Diagnosis: Pain in right leg  Difficulty in walking, not elsewhere classified  Other muscle spasm  Muscle weakness (generalized)     Problem List  There are no active problems to display for this patient.   Beacher MayBrooks, Keir Foland PT 09/17/2016, 8:28 PM  [facility] [address] [place], [place], [address] Phone: [phone]   Fax:  [phone]  Name: Peter Stanley MRN: [id number] Date of Birth: [birth year]

## 2016-09-21 ENCOUNTER — Ambulatory Visit: Payer: Self-pay | Admitting: Physical Therapy

## 2016-09-21 DIAGNOSIS — M79604 Pain in right leg: Secondary | ICD-10-CM

## 2016-09-21 DIAGNOSIS — R262 Difficulty in walking, not elsewhere classified: Secondary | ICD-10-CM

## 2016-09-21 DIAGNOSIS — M6281 Muscle weakness (generalized): Secondary | ICD-10-CM

## 2016-09-21 DIAGNOSIS — M62838 Other muscle spasm: Secondary | ICD-10-CM

## 2016-09-22 ENCOUNTER — Ambulatory Visit: Payer: Self-pay | Admitting: Physical Therapy

## 2016-09-22 ENCOUNTER — Encounter: Payer: Self-pay | Admitting: Physical Therapy

## 2016-09-22 DIAGNOSIS — M79604 Pain in right leg: Secondary | ICD-10-CM

## 2016-09-22 DIAGNOSIS — M6281 Muscle weakness (generalized): Secondary | ICD-10-CM

## 2016-09-22 DIAGNOSIS — M62838 Other muscle spasm: Secondary | ICD-10-CM

## 2016-09-22 DIAGNOSIS — R262 Difficulty in walking, not elsewhere classified: Secondary | ICD-10-CM

## 2016-09-22 NOTE — Therapy (Signed)
[facility] [address] [place], [address] Phone: [phone]   Fax:  [phone]  Physical Therapy Treatment  Patient Details  Name: Peter Stanley MRN: [id number] Date of Birth: 1980-12-23 Referring Provider: Abram Sander MD  Encounter Date: 09/21/2016      PT End of Session - 09/21/16 1930    Visit Number 12   Number of Visits 36   Date for PT Re-Evaluation 11/13/16   PT Start Time 1838   PT Stop Time 1920   PT Time Calculation (min) 42 min   Activity Tolerance Patient limited by pain;Patient limited by fatigue   Behavior During Therapy [facility] for tasks assessed/performed      Past Medical History:  Diagnosis Date  . Allergy   . Anxiety     No past surgical history on file.  There were no vitals filed for this visit.      Subjective Assessment - 09/21/16 1842    Subjective Patient reports he is "hurting" today. He felt less discomfort in his knee following last session after NuStep than the first time he was on the machine.    Limitations Sitting;Standing;Walking;House hold activities;Other (comment)   Patient Stated Goals to be able to perform personal care activites and return to normal activites without limitaitons (prior level of function)   Currently in Pain? Yes   Pain Score 5    Pain Location Knee   Pain Orientation Right   Pain Descriptors / Indicators Aching;Tightness   Pain Type Acute pain;Surgical pain  05/30/2016   Pain Onset More than a month ago   Pain Frequency Constant      Objective:  Gait: ambulating with 2 wheeled walker NWB RLE, holding right LE off floor due to pain in leg Palpation: Decreased soft tissue mobility along right thigh,lateral thigh close to right hipand just proximal to patella, anterior thigh with moderate pain on palpation of quadriceps spasms    Treatment:  Manual therapy: STM right quadriceps,along proximal lateral thigh,  anterior thighgoal: to improve mobility/decrease pain with patient supine partially reclined and right LE on pillow Patellar mobilization with patella mobilizer: distraction with inferior, medial, lateral glides x 3 reps  Therapeutic exercise: patient performed with assistance, verbal and tactile cues of therapist: AAROM: right LE knee flexion/extension with graduated motion at tolerated multiple sets with patient partially reclined TKE x 10 reps with hold in extension 2-3 seconds (very difficult and using lots of effort) Nustep x 5 min. With assistance of therapist on/off machine and to support right knee to be able to perform knee flexion   Modalities:  Electrical stimulation; Russian stim. 10/10 cycle right VMO/quadriceps, high volt estim. Clinical protocol for muscle spasms with electrodes applied to lateral thigh over spasms, intensity to tolerance  x .With patient in reclined position with pillow under right knee/LE; Moist heat applied to right knee, hamstring in conjunction with estim. (no adverse effects noted) Goal: muscle re education, pain control, reduce spasms, improve ROM  Patient response to treatment: Patient demonstrated improved technique with exercises with moderate assistance for correct alignment and to perform exercises. Patient with increased knee pain with flexion exercises and increased effort with TKE.  Patient with decreased spasms by 50% following STM. Improved motor control with repetition and cuing, following estim.        PT Education - 09/21/16 1930    Education provided Yes   Education Details HEP: continue to work on weight shifting and ROM right  knee   Person(s) Educated Patient   Methods Explanation   Comprehension Verbalized understanding             PT Long Term Goals - 08/20/16 1733      PT LONG TERM GOAL #1   Title Patient will improve AAROM right knee up to 90 degrees by 09/16/2016 to allow him to sit and walk with more  normal gait pattern and to allow decreased pain in knee   Baseline AAROM right knee 0-10 degrees flexion with pain   Status New     PT LONG TERM GOAL #2   Title patient will improve LEFS to 25/80 by 09/20/16 to demonstrate improving function with daily activities    Baseline LEFS 0/80   Status New     PT LONG TERM GOAL #3   Title patient will have pain level max of 6/10 by 10/20/2016 to allow improved function with moving, sitting and walking   Baseline pain level max 10/10 with aggravating activities   Status New     PT LONG TERM GOAL #4   Title imrpoved LEFS to 50/80 or better by 11/13/2016 indicating improved function with daily tasks/walking/personal care   Baseline LEFS 0/80   Status New     PT LONG TERM GOAL #5   Title patient will be able to walk with least restrictive device safely on level surfaces and stairs by 11/13/2016 to improve function with household and community activities   Baseline walking with rolling walker, WBAT RLE (non weight bearing due to pain)   Status New     Additional Long Term Goals   Additional Long Term Goals Yes     PT LONG TERM GOAL #6   Title patient will be independent with home program for pain control, flexibiltiy, strength for right LE by discharge 11/13/16 in order to continue to improve function with self management   Baseline requires assistance and guidance for all exercises, limited knowledge of appropriate pain control strategies, progression of exercises    Status New               Plan - 09/21/16 1958    Clinical Impression Statement Patient able to tolerate NuStep with increased pain right knee with increased flexion. He is progressing slowly due to spasms and pain and is demonstrating improvement with decreasing spasms and pain in right LE allowing steady improvement with knee ROM and increased weight bearing right LE for ambulation.   Rehab Potential Good   PT Frequency 3x / week   PT Duration 12 weeks   PT  Treatment/Interventions Electrical Stimulation;Gait training;Moist Heat;Therapeutic activities;Therapeutic exercise;Balance training;Neuromuscular re-education;Patient/family education;Passive range of motion;Scar mobilization;Manual techniques   PT Next Visit Plan modalities for pain control, muscle spasms, manual soft tissue mobilization, therapeutic exercise for ROM, gait   PT Home Exercise Plan ROM, quad sets every hour, foot intrinsic exercises, pain control, hip/knee flexion with assistance, weight shifting side to side in standing      Patient will benefit from skilled therapeutic intervention in order to improve the following deficits and impairments:  Decreased strength, Decreased balance, Hypomobility, Impaired flexibility, Pain, Impaired perceived functional ability, Decreased activity tolerance, Decreased endurance, Increased muscle spasms, Difficulty walking, Decreased range of motion  Visit Diagnosis: Pain in right leg  Difficulty in walking, not elsewhere classified  Other muscle spasm  Muscle weakness (generalized)     Problem List There are no active problems to display for this patient.   Beacher MayBrooks, Deazia Lampi PT 09/22/2016, 5:02 PM  [facility] [address] [place], [place], [address] Phone: [phone]   Fax:  [phone]  Name: Peter Stanley MRN: [id number] Date of Birth: [birth year]

## 2016-09-23 ENCOUNTER — Ambulatory Visit: Payer: Self-pay | Admitting: Physical Therapy

## 2016-09-23 NOTE — Therapy (Signed)
[facility] [address] [place], [place], [address] Phone: [phone]   Fax:  [phone]  Physical Therapy Treatment  Patient Details  Name: Peter BrunsMatthew T Stanley MRN: [id number] Date of Birth: Sep 24, 1981 Referring Provider: Abram Sanderpatterson, jennifer MD  Encounter Date: 09/22/2016      PT End of Session - 09/22/16 1941    Visit Number 13   Number of Visits 36   Date for PT Re-Evaluation 11/13/16   PT Start Time 1833   PT Stop Time 1920   PT Time Calculation (min) 47 min   Activity Tolerance Patient limited by pain;Patient limited by fatigue   Behavior During Therapy [facility] for tasks assessed/performed      Past Medical History:  Diagnosis Date  . Allergy   . Anxiety     History reviewed. No pertinent surgical history.  There were no vitals filed for this visit.      Subjective Assessment - 09/22/16 1908    Subjective Burning into right knee anterior aspect   Limitations Sitting;Standing;Walking;House hold activities;Other (comment)   Patient Stated Goals to be able to perform personal care activites and return to normal activites without limitaitons (prior level of function)   Currently in Pain? Yes   Pain Score 6    Pain Location Knee   Pain Orientation Right   Pain Descriptors / Indicators Aching;Tightness   Pain Type Acute pain;Surgical pain  05/30/2016   Pain Onset More than a month ago   Pain Frequency Constant      Objective:  Gait: ambulating with 2 wheeled walker NWB/PWB RLE, holding right LE off floor due to pain in leg Palpation: Decreased soft tissue mobility along right thigh,and just proximal to patella, anterior thigh with moderatepain on palpation ofquadriceps spasms    Treatment:  Manual therapy: STM right quadriceps,, anterior thighgoal: to improve mobility/decrease pain with patient supine partially reclined and right LE on pillow Patellar mobilization with patella mobilizer:  distraction with inferior, medial, lateral glides x 3 reps  Therapeutic exercise: patient performed with assistance, verbal and tactile cues of therapist: AAROM: right LE knee flexion/extension with graduated motion at tolerated multiple sets with patient partially reclined TKE x 10reps with hold in extension 2-3 seconds (very difficult and using lots of effort)   Modalities:  Electrical stimulation; Russian stim. 10/10 cycle right VMO/quadriceps, high volt estim. Clinical protocol for muscle spasms with electrodes applied to lateral thigh over spasms, intensity to tolerance  x 20min.With patient in reclined position with pillow under right knee/LE; Moist heat applied to right knee, hamstring in conjunction with estim. (no adverse effects noted) Goal: muscle re education, pain control, reduce spasms, improve ROM  Patient response to treatment: Patient with significant decrease in spasms with improved soft tissue elasticity following STM and electrical stimulation. He is progressing with increased right knee flexion in sitting to ~40 degrees with less difficulty and increased pain that limits further motion.         PT Education - 09/22/16 1930    Education provided Yes   Education Details HEP: seated knee flexion, weight shifting, ROM exercises supine lying   Person(s) Educated Patient   Methods Explanation   Comprehension Verbalized understanding             PT Long Term Goals - 08/20/16 1733      PT LONG TERM GOAL #1   Title Patient will improve AAROM right knee up to 90 degrees by 09/16/2016 to allow him to  sit and walk with more normal gait pattern and to allow decreased pain in knee   Baseline AAROM right knee 0-10 degrees flexion with pain   Status New     PT LONG TERM GOAL #2   Title patient will improve LEFS to 25/80 by 09/20/16 to demonstrate improving function with daily activities    Baseline LEFS 0/80   Status New     PT LONG TERM GOAL #3   Title  patient will have pain level max of 6/10 by 10/20/2016 to allow improved function with moving, sitting and walking   Baseline pain level max 10/10 with aggravating activities   Status New     PT LONG TERM GOAL #4   Title imrpoved LEFS to 50/80 or better by 11/13/2016 indicating improved function with daily tasks/walking/personal care   Baseline LEFS 0/80   Status New     PT LONG TERM GOAL #5   Title patient will be able to walk with least restrictive device safely on level surfaces and stairs by 11/13/2016 to improve function with household and community activities   Baseline walking with rolling walker, WBAT RLE (non weight bearing due to pain)   Status New     Additional Long Term Goals   Additional Long Term Goals Yes     PT LONG TERM GOAL #6   Title patient will be independent with home program for pain control, flexibiltiy, strength for right LE by discharge 11/13/16 in order to continue to improve function with self management   Baseline requires assistance and guidance for all exercises, limited knowledge of appropriate pain control strategies, progression of exercises    Status New               Plan - 09/22/16 1942    Clinical Impression Statement Patient is progressing slowly due to extent of injury/healing femur fracture and pain. He continues with moderate to severe spasms and pain in right thigh/knee that limits mobility and ability to walk and sit and will therefore benefit from additional physical therapy intervention to achieve goals.    Rehab Potential Good   PT Frequency 3x / week   PT Duration 12 weeks   PT Treatment/Interventions Electrical Stimulation;Gait training;Moist Heat;Therapeutic activities;Therapeutic exercise;Balance training;Neuromuscular re-education;Patient/family education;Passive range of motion;Scar mobilization;Manual techniques   PT Next Visit Plan modalities for pain control, muscle spasms, manual soft tissue mobilization, therapeutic  exercise for ROM, gait   PT Home Exercise Plan ROM, quad sets every hour, foot intrinsic exercises, pain control, hip/knee flexion with assistance, weight shifting side to side in standing      Patient will benefit from skilled therapeutic intervention in order to improve the following deficits and impairments:  Decreased strength, Decreased balance, Hypomobility, Impaired flexibility, Pain, Impaired perceived functional ability, Decreased activity tolerance, Decreased endurance, Increased muscle spasms, Difficulty walking, Decreased range of motion  Visit Diagnosis: Pain in right leg  Difficulty in walking, not elsewhere classified  Other muscle spasm  Muscle weakness (generalized)     Problem List There are no active problems to display for this patient.   Beacher MayBrooks, Amilliana Hayworth PT 09/23/2016, 10:58 PM  [facility] [address] [place], [place], [address] Phone: [phone]   Fax:  [phone]  Name: Peter BrunsMatthew T Brenneman MRN: [id number] Date of Birth: 18-Dec-1980

## 2016-09-24 ENCOUNTER — Ambulatory Visit: Payer: Self-pay | Admitting: Physical Therapy

## 2016-09-24 DIAGNOSIS — M79604 Pain in right leg: Secondary | ICD-10-CM

## 2016-09-24 DIAGNOSIS — R262 Difficulty in walking, not elsewhere classified: Secondary | ICD-10-CM

## 2016-09-24 DIAGNOSIS — M62838 Other muscle spasm: Secondary | ICD-10-CM

## 2016-09-24 DIAGNOSIS — M6281 Muscle weakness (generalized): Secondary | ICD-10-CM

## 2016-09-24 NOTE — Therapy (Signed)
[facility] [address] [place], [place], [address] Phone: [phone]   Fax:  [phone]  Physical Therapy Treatment  Patient Details  Name: Peter BrunsMatthew T Wedig MRN: [id number] Date of Birth: Oct 13, 1981 Referring Provider: Abram Sanderpatterson, jennifer MD  Encounter Date: 09/24/2016      PT End of Session - 09/24/16 1945    Visit Number 14   Number of Visits 36   Date for PT Re-Evaluation 11/13/16   PT Start Time 1850   PT Stop Time 1930   PT Time Calculation (min) 40 min   Activity Tolerance Patient limited by pain;Patient limited by fatigue   Behavior During Therapy [facility] for tasks assessed/performed      Past Medical History:  Diagnosis Date  . Allergy   . Anxiety     No past surgical history on file.  There were no vitals filed for this visit.      Subjective Assessment - 09/24/16 1900    Subjective Right knee is still tight and sore.    Limitations Sitting;Standing;Walking;House hold activities;Other (comment)   Patient Stated Goals to be able to perform personal care activites and return to normal activites without limitaitons (prior level of function)   Currently in Pain? Yes   Pain Score 4    Pain Location Knee   Pain Orientation Right   Pain Descriptors / Indicators Aching;Tightness   Pain Type Acute pain;Surgical pain   Pain Onset More than a month ago   Pain Frequency Constant      Objective:  Gait: ambulating with 2 wheeled walker NWB/PWB RLE, holding right LE off floor due to pain in leg Palpation: Decreased soft tissue mobility along right thigh,and just proximal to patella, anterior thigh with moderatepain on palpation ofquadriceps spasms    Treatment:  Manual therapy: STM right quadriceps,, anterior thighgoal: to improve mobility/decrease pain with patient supine partially reclined and right LE on pillow Patellar mobilization with patella mobilizer: distraction with inferior,  medial, lateral glides x 3 reps  Therapeutic exercise: patient performed with assistance, verbal and tactile cues of therapist: AAROM: right LE knee flexion/extension with graduated motion at tolerated multiple sets with patient partially reclined and seated on side of treatment table NuStep x 5 min. Level #2 for ROM and weight shifting (requires assistance to get on/off NuStep; holding up right LE for patient)   Modalities:  Electrical stimulation; Russian stim. 10/10 cycle right VMO/quadriceps, high volt estim. Clinical protocol for muscle spasms with electrodes applied to lateral thigh over spasms, intensity to tolerance  x 20min.With patient in reclined position with pillow under right knee/LE; Moist heat applied to right knee, hamstring in conjunction with estim. (no adverse effects noted) Goal: muscle re education, pain control, reduce spasms, improve ROM  Patient response to treatment: Patient with decreased spasms in right quadriceps, allowing improved ability to get on/off NuStep. Required assistance with right LE for all exercises. Patient with significant decrease in spasms with improved soft tissue elasticity following STM and electrical stimulation.       PT Education - 09/24/16 1930    Education provided Yes   Education Details HEP: work on knee flexion and ambulating with increased weight shifting, walking with correct gait pattern   Person(s) Educated Patient   Methods Explanation   Comprehension Verbalized understanding             PT Long Term Goals - 08/20/16 1733      PT LONG TERM GOAL #1  Title Patient will improve AAROM right knee up to 90 degrees by 09/16/2016 to allow him to sit and walk with more normal gait pattern and to allow decreased pain in knee   Baseline AAROM right knee 0-10 degrees flexion with pain   Status New     PT LONG TERM GOAL #2   Title patient will improve LEFS to 25/80 by 09/20/16 to demonstrate improving function with daily  activities    Baseline LEFS 0/80   Status New     PT LONG TERM GOAL #3   Title patient will have pain level max of 6/10 by 10/20/2016 to allow improved function with moving, sitting and walking   Baseline pain level max 10/10 with aggravating activities   Status New     PT LONG TERM GOAL #4   Title imrpoved LEFS to 50/80 or better by 11/13/2016 indicating improved function with daily tasks/walking/personal care   Baseline LEFS 0/80   Status New     PT LONG TERM GOAL #5   Title patient will be able to walk with least restrictive device safely on level surfaces and stairs by 11/13/2016 to improve function with household and community activities   Baseline walking with rolling walker, WBAT RLE (non weight bearing due to pain)   Status New     Additional Long Term Goals   Additional Long Term Goals Yes     PT LONG TERM GOAL #6   Title patient will be independent with home program for pain control, flexibiltiy, strength for right LE by discharge 11/13/16 in order to continue to improve function with self management   Baseline requires assistance and guidance for all exercises, limited knowledge of appropriate pain control strategies, progression of exercises    Status New               Plan - 09/24/16 2319    Clinical Impression Statement Patient continues with slow progress with ROM and strength limitaiotns that liimts ability to walk without assistive device. He is responding favorably with current treatment and should continue to improve with additional physical therapy intervention to improve deficits in order to be able to walk without AD and return to maximal funciton   PT Frequency 3x / week   PT Duration 12 weeks   PT Treatment/Interventions Electrical Stimulation;Gait training;Moist Heat;Therapeutic activities;Therapeutic exercise;Balance training;Neuromuscular re-education;Patient/family education;Passive range of motion;Scar mobilization;Manual techniques   PT Next  Visit Plan modalities for pain control, muscle spasms, manual soft tissue mobilization, therapeutic exercise for ROM, gait   PT Home Exercise Plan ROM, quad sets every hour, foot intrinsic exercises, pain control, hip/knee flexion with assistance, weight shifting side to side in standing      Patient will benefit from skilled therapeutic intervention in order to improve the following deficits and impairments:  Decreased strength, Decreased balance, Hypomobility, Impaired flexibility, Pain, Impaired perceived functional ability, Decreased activity tolerance, Decreased endurance, Increased muscle spasms, Difficulty walking, Decreased range of motion  Visit Diagnosis: Pain in right leg  Difficulty in walking, not elsewhere classified  Other muscle spasm  Muscle weakness (generalized)     Problem List There are no active problems to display for this patient.   Beacher MayBrooks, Honor Fairbank PT 09/25/2016, 8:51 PM  [place] [facility] [address] [place], [place], [address] Phone: [phone]   Fax:  [phone]  Name: Peter BrunsMatthew T Tootle MRN: [id number] Date of Birth: 04/21/1981

## 2016-09-28 ENCOUNTER — Ambulatory Visit: Payer: Self-pay | Admitting: Physical Therapy

## 2016-09-28 ENCOUNTER — Encounter: Payer: Self-pay | Admitting: Physical Therapy

## 2016-09-28 DIAGNOSIS — M79604 Pain in right leg: Secondary | ICD-10-CM

## 2016-09-28 DIAGNOSIS — M6281 Muscle weakness (generalized): Secondary | ICD-10-CM

## 2016-09-28 DIAGNOSIS — M62838 Other muscle spasm: Secondary | ICD-10-CM

## 2016-09-28 DIAGNOSIS — M25661 Stiffness of right knee, not elsewhere classified: Secondary | ICD-10-CM

## 2016-09-28 DIAGNOSIS — R262 Difficulty in walking, not elsewhere classified: Secondary | ICD-10-CM

## 2016-09-29 ENCOUNTER — Encounter: Payer: Self-pay | Admitting: Physical Therapy

## 2016-09-29 ENCOUNTER — Ambulatory Visit: Payer: Self-pay | Admitting: Physical Therapy

## 2016-09-29 DIAGNOSIS — M6281 Muscle weakness (generalized): Secondary | ICD-10-CM

## 2016-09-29 DIAGNOSIS — M62838 Other muscle spasm: Secondary | ICD-10-CM

## 2016-09-29 DIAGNOSIS — R262 Difficulty in walking, not elsewhere classified: Secondary | ICD-10-CM

## 2016-09-29 DIAGNOSIS — M79604 Pain in right leg: Secondary | ICD-10-CM

## 2016-09-29 NOTE — Therapy (Signed)
[place] [facility] [address] [place], [place], [address] Phone: [phone]   Fax:  [phone]  Physical Therapy Treatment  Patient Details  Name: Peter BrunsMatthew T Stanley MRN: [id number] Date of Birth: 05-May-1981 Referring Provider: Abram Sanderpatterson, jennifer MD  Encounter Date: 09/28/2016      PT End of Session - 09/28/16 1849    Visit Number 15   Number of Visits 36   Date for PT Re-Evaluation 11/13/16   PT Start Time 1840   PT Stop Time 1920   PT Time Calculation (min) 40 min   Activity Tolerance Patient limited by pain;Patient limited by fatigue   Behavior During Therapy [facility] for tasks assessed/performed      Past Medical History:  Diagnosis Date  . Allergy   . Anxiety     History reviewed. No pertinent surgical history.  There were no vitals filed for this visit.      Subjective Assessment - 09/28/16 2021    Subjective had knee bent 2x by accident and leg is very sore today   Limitations Sitting;Standing;Walking;House hold activities;Other (comment)   Patient Stated Goals to be able to perform personal care activites and return to normal activites without limitaitons (prior level of function)   Currently in Pain? Yes   Pain Score 5    Pain Location Knee   Pain Orientation Right   Pain Descriptors / Indicators Aching;Tightness   Pain Type Acute pain;Surgical pain  05/30/2016   Pain Onset More than a month ago   Pain Frequency Constant        .    Objective:  Gait: ambulating with 2 wheeled walker NWB/PWBRLE, holding right LE off floor  Palpation: Decreased soft tissue mobility along right thigh,and just proximal to patella, anterior thigh with moderatepain on palpation ofquadriceps spasms    Treatment:  Manual therapy: STM right quadriceps,, anterior thighgoal: to improve mobility/decrease pain with patient supine partially reclined and right LE on pillow Patellar mobilization with patella  mobilizer: distraction with inferior, medial, lateral glides x 3 reps  Therapeutic exercise: patient performed with assistance, verbal and tactile cues of therapist: AAROM: right LE knee flexion/extension with graduated motion at tolerated multiple sets with patient partially reclined and seated on side of treatment table NuStep x 5 min. Level #2 for ROM and weight shifting (requires assistance to get on/off NuStep; holding up right LE for patient)   Modalities:  Electrical stimulation; Russian stim. 10/10 cycle right VMO/quadriceps, high volt estim. Clinical protocol for muscle spasms with electrodes applied to lateral thigh over spasms, intensity to tolerance  x 20min.With patient in reclined position with pillow under right knee/LE; Moist heat applied to right knee, in conjunction with estim. (no adverse effects noted) Goal: muscle re education, pain control, reduce spasms, improve ROM  Patient response to treatment: ROM following session 45 degrees right knee flexion. Patient with 30% reduction in spasms in quadriceps muscle following STM. Required assistance to get on/off NuStep. Patient reported improved flexibility in right knee following estim.        PT Education - 09/28/16 1847    Education provided Yes   Education Details HEP: continue to work on knee flexion as able every hour   Person(s) Educated Patient   Methods Explanation   Comprehension Verbalized understanding             PT Long Term Goals - 08/20/16 1733      PT LONG TERM GOAL #1   Title Patient  will improve AAROM right knee up to 90 degrees by 09/16/2016 to allow him to sit and walk with more normal gait pattern and to allow decreased pain in knee   Baseline AAROM right knee 0-10 degrees flexion with pain   Status New     PT LONG TERM GOAL #2   Title patient will improve LEFS to 25/80 by 09/20/16 to demonstrate improving function with daily activities    Baseline LEFS 0/80   Status New     PT  LONG TERM GOAL #3   Title patient will have pain level max of 6/10 by 10/20/2016 to allow improved function with moving, sitting and walking   Baseline pain level max 10/10 with aggravating activities   Status New     PT LONG TERM GOAL #4   Title imrpoved LEFS to 50/80 or better by 11/13/2016 indicating improved function with daily tasks/walking/personal care   Baseline LEFS 0/80   Status New     PT LONG TERM GOAL #5   Title patient will be able to walk with least restrictive device safely on level surfaces and stairs by 11/13/2016 to improve function with household and community activities   Baseline walking with rolling walker, WBAT RLE (non weight bearing due to pain)   Status New     Additional Long Term Goals   Additional Long Term Goals Yes     PT LONG TERM GOAL #6   Title patient will be independent with home program for pain control, flexibiltiy, strength for right LE by discharge 11/13/16 in order to continue to improve function with self management   Baseline requires assistance and guidance for all exercises, limited knowledge of appropriate pain control strategies, progression of exercises    Status New               Plan - 09/28/16 1921    Clinical Impression Statement Patient is progressing slowly due to pain, stiffness right knee. He is responding well to current treatment and continues to require assistance of therapist to perform all exercises.    Rehab Potential Good   PT Frequency 3x / week   PT Duration 12 weeks   PT Treatment/Interventions Electrical Stimulation;Gait training;Moist Heat;Therapeutic activities;Therapeutic exercise;Balance training;Neuromuscular re-education;Patient/family education;Passive range of motion;Scar mobilization;Manual techniques   PT Next Visit Plan modalities for pain control, muscle spasms, manual soft tissue mobilization, therapeutic exercise for ROM, gait   PT Home Exercise Plan ROM, quad sets every hour, foot intrinsic  exercises, pain control, hip/knee flexion with assistance, weight shifting side to side in standing      Patient will benefit from skilled therapeutic intervention in order to improve the following deficits and impairments:  Decreased strength, Decreased balance, Hypomobility, Impaired flexibility, Pain, Impaired perceived functional ability, Decreased activity tolerance, Decreased endurance, Increased muscle spasms, Difficulty walking, Decreased range of motion  Visit Diagnosis: Pain in right leg  Difficulty in walking, not elsewhere classified  Other muscle spasm  Muscle weakness (generalized)  Stiffness of right knee, not elsewhere classified     Problem List There are no active problems to display for this patient.   Beacher MayBrooks, Xandra Laramee PT 09/29/2016, 6:56 PM  [facility] [facility] [address] [place], [place], [address] Phone: [phone]   Fax:  [phone]  Name: Peter BrunsMatthew T Stanley MRN: [id number] Date of Birth: 02-Jun-1981

## 2016-09-30 ENCOUNTER — Encounter: Admitting: Physical Therapy

## 2016-09-30 NOTE — Therapy (Signed)
[facility] [address] [place], [place], [address] Phone: [phone]   Fax:  [phone]  Physical Therapy Treatment  Patient Details  Name: Peter Stanley MRN: [id number] Date of Birth: May 15, 1981 Referring Provider: Abram Sanderpatterson, jennifer MD  Encounter Date: 09/29/2016      PT End of Session - 09/29/16 1930    Visit Number 16   Number of Visits 36   Date for PT Re-Evaluation 11/13/16   PT Start Time 1835   PT Stop Time 1915   PT Time Calculation (min) 40 min   Activity Tolerance Patient limited by pain;Patient limited by fatigue   Behavior During Therapy [facility] for tasks assessed/performed      Past Medical History:  Diagnosis Date  . Allergy   . Anxiety     History reviewed. No pertinent surgical history.  There were no vitals filed for this visit.      Subjective Assessment - 09/29/16 1926    Subjective Still sore, doing better with ability to bend knee.   Limitations Sitting;Standing;Walking;House hold activities;Other (comment)   Patient Stated Goals to be able to perform personal care activites and return to normal activites without limitaitons (prior level of function)   Currently in Pain? Yes   Pain Score 5    Pain Location Knee   Pain Orientation Right   Pain Descriptors / Indicators Aching;Tightness   Pain Type Acute pain;Surgical pain  05/30/2016   Pain Onset More than a month ago   Pain Frequency Constant          Objective:  Gait: ambulating with 2 wheeled walker NWB/PWBRLE, holding right LE off floor  Palpation: Decreased soft tissue mobility along right thigh,and just proximal to patella, anterior thigh with moderatepain on palpation ofquadriceps spasms    Treatment:  Manual therapy: STM right quadriceps,, anterior thighgoal: to improve mobility/decrease pain with patient supine partially reclined and right LE on pillow Patellar mobilization with patella  mobilizer: distraction with inferior, medial, lateral glides x 3 reps  Therapeutic exercise: patient performed with assistance, verbal and tactile cues of therapist: Standing weight shift with support of walker side to side and forward and back x 1 min. Each hip knee flexion with assistance of therapist x 5 reps hip abduction with isometric resistance x 10 knee extension, flexion with assistance long sitting x 10 sitting knee flexion x 2-3 reps with transfers gait ambulating with right foot on ground practice proper weight shift, bearing onto right LE  Modalities:  Electrical stimulation; Russian stim. 10/10 cycle right VMO/quadriceps, high volt estim. Clinical protocol for muscle spasms with electrodes applied to lateral thigh over spasms, intensity to tolerance  x 20min.With patient in reclined position with pillow under right knee/LE; Moist heat applied to right knee, in conjunction with estim. (no adverse effects noted) Goal: muscle re education, pain control, reduce spasms, improve ROM  Patient response to treatment: Patient required assistance with all exercises with pain reported with all exercises. Improved flexibility in right knee with repeated ROM exercises. Demonstrated improved quad control with estim.         PT Education - 09/29/16 1930    Education provided Yes   Education Details HEP: knee flexion in sitting to encourage flexiblity, weight shifting in standing side to side and forward,/back x 2 miin. daily   Person(s) Educated Patient   Methods Explanation;Demonstration;Verbal cues   Comprehension Verbalized understanding;Returned demonstration;Verbal cues required  PT Long Term Goals - 08/20/16 1733      PT LONG TERM GOAL #1   Title Patient will improve AAROM right knee up to 90 degrees by 09/16/2016 to allow him to sit and walk with more normal gait pattern and to allow decreased pain in knee   Baseline AAROM right knee 0-10 degrees flexion  with pain   Status New     PT LONG TERM GOAL #2   Title patient will improve LEFS to 25/80 by 09/20/16 to demonstrate improving function with daily activities    Baseline LEFS 0/80   Status New     PT LONG TERM GOAL #3   Title patient will have pain level max of 6/10 by 10/20/2016 to allow improved function with moving, sitting and walking   Baseline pain level max 10/10 with aggravating activities   Status New     PT LONG TERM GOAL #4   Title imrpoved LEFS to 50/80 or better by 11/13/2016 indicating improved function with daily tasks/walking/personal care   Baseline LEFS 0/80   Status New     PT LONG TERM GOAL #5   Title patient will be able to walk with least restrictive device safely on level surfaces and stairs by 11/13/2016 to improve function with household and community activities   Baseline walking with rolling walker, WBAT RLE (non weight bearing due to pain)   Status New     Additional Long Term Goals   Additional Long Term Goals Yes     PT LONG TERM GOAL #6   Title patient will be independent with home program for pain control, flexibiltiy, strength for right LE by discharge 11/13/16 in order to continue to improve function with self management   Baseline requires assistance and guidance for all exercises, limited knowledge of appropriate pain control strategies, progression of exercises    Status New               Plan - 09/29/16 1931    Clinical Impression Statement progressing steadily with ROM, decreasing spasms, improving weight shift as he heals from comminuted fracture of femur. He continues with pain with all exercise and requires assistance to complete. He is not able to put full weight on right LE due to pain and will therefore require additional physical therapy intervention to achieve goals   Rehab Potential Good   PT Frequency 3x / week   PT Duration 12 weeks   PT Treatment/Interventions Electrical Stimulation;Gait training;Moist Heat;Therapeutic  activities;Therapeutic exercise;Balance training;Neuromuscular re-education;Patient/family education;Passive range of motion;Scar mobilization;Manual techniques   PT Next Visit Plan modalities for pain control, muscle spasms, manual soft tissue mobilization, therapeutic exercise for ROM, gait   PT Home Exercise Plan ROM, quad sets every hour, foot intrinsic exercises, pain control, hip/knee flexion with assistance, weight shifting side to side in standing      Patient will benefit from skilled therapeutic intervention in order to improve the following deficits and impairments:  Decreased strength, Decreased balance, Hypomobility, Impaired flexibility, Pain, Impaired perceived functional ability, Decreased activity tolerance, Decreased endurance, Increased muscle spasms, Difficulty walking, Decreased range of motion  Visit Diagnosis: Pain in right leg  Difficulty in walking, not elsewhere classified  Other muscle spasm  Muscle weakness (generalized)     Problem List There are no active problems to display for this patient.   Beacher MayBrooks, Isaias Dowson PT 09/30/2016, 9:58 PM  [facility] [address] [place], [place], [address] Phone: [phone]  Fax:  [phone]  Name: Peter Stanley MRN: [id number] Date of Birth: Nov 25, 1980

## 2016-10-01 ENCOUNTER — Ambulatory Visit: Payer: Self-pay | Admitting: Physical Therapy

## 2016-10-05 ENCOUNTER — Ambulatory Visit: Payer: Self-pay | Admitting: Physical Therapy

## 2016-10-05 DIAGNOSIS — M25661 Stiffness of right knee, not elsewhere classified: Secondary | ICD-10-CM

## 2016-10-05 DIAGNOSIS — R262 Difficulty in walking, not elsewhere classified: Secondary | ICD-10-CM

## 2016-10-05 DIAGNOSIS — M62838 Other muscle spasm: Secondary | ICD-10-CM

## 2016-10-05 DIAGNOSIS — M6281 Muscle weakness (generalized): Secondary | ICD-10-CM

## 2016-10-05 DIAGNOSIS — M79604 Pain in right leg: Secondary | ICD-10-CM

## 2016-10-06 ENCOUNTER — Ambulatory Visit: Payer: Self-pay | Admitting: Physical Therapy

## 2016-10-06 ENCOUNTER — Encounter: Payer: Self-pay | Admitting: Physical Therapy

## 2016-10-06 DIAGNOSIS — R262 Difficulty in walking, not elsewhere classified: Secondary | ICD-10-CM

## 2016-10-06 DIAGNOSIS — M6281 Muscle weakness (generalized): Secondary | ICD-10-CM

## 2016-10-06 DIAGNOSIS — M79604 Pain in right leg: Secondary | ICD-10-CM

## 2016-10-06 DIAGNOSIS — M62838 Other muscle spasm: Secondary | ICD-10-CM

## 2016-10-06 DIAGNOSIS — M25661 Stiffness of right knee, not elsewhere classified: Secondary | ICD-10-CM

## 2016-10-06 NOTE — Therapy (Signed)
[place] [facility] [address] [place], [place], [address] Phone: [phone]   Fax:  [phone]  Physical Therapy Treatment  Patient Details  Name: Peter BrunsMatthew T Stanley MRN: [id number] Date of Birth: 29-May-1981 Referring Provider: Abram Sanderpatterson, jennifer MD  Encounter Date: 10/05/2016      PT End of Session - 10/05/16 1712    Visit Number 17   Number of Visits 36   Date for PT Re-Evaluation 11/13/16   PT Start Time 1624   PT Stop Time 1710   PT Time Calculation (min) 46 min   Activity Tolerance Patient limited by pain;Patient limited by fatigue   Behavior [facility] for tasks assessed/performed      Past Medical History:  Diagnosis Date  . Allergy   . Anxiety     No past surgical history on file.  There were no vitals filed for this visit.      Subjective Assessment - 10/05/16 1625    Subjective Patient reports that he was able to pick up his leg and get out of bed this morning for the first time and he feels his knee is able to bend more with less difficulty.    Limitations Sitting;Standing;Walking;House hold activities;Other (comment)   Patient Stated Goals to be able to perform personal care activites and return to normal activites without limitaitons (prior level of function)   Currently in Pain? Yes   Pain Score 5    Pain Location Knee   Pain Orientation Right   Pain Descriptors / Indicators Aching;Tightness   Pain Type Acute pain;Surgical pain  05/30/2016   Pain Onset More than a month ago   Pain Frequency Constant      Objective:  Gait: ambulating with 2 wheeled walker NWB/PWBRLE, holding right LE off floor  Palpation: Decreased soft tissue mobility along right thigh,and just proximal to patella, anterior thigh with moderatepain on palpation ofquadriceps spasms    Treatment:  Manual therapy: STM right quadriceps,, anterior thighgoal: to improve mobility/decrease pain with  patient supine partially reclined and right LE on pillow Patellar mobilization with patella mobilizer: distraction with inferior, medial, lateral glides x 3 reps Tibia/femoral joint mobilization PA glides to increase knee flexion grade 2 multiple sets 20 seconds   Therapeutic exercise: patient performed with assistance, verbal and tactile cues of therapist: hip knee flexion with assistance of therapist x 5 reps hip abduction with isometric resistance x 10 knee extension, flexion with assistance long sitting x 10 sitting knee flexion x 2-3 reps with transfers   Modalities:  Electrical stimulation; Russian stim. 10/10 cycle right VMO/quadriceps, high volt estim. Clinical protocol for muscle spasms with electrodes applied to lateral thigh over spasms, intensity to tolerance  x 20min.With patient in reclined position with pillow under right knee/LE; Moist heat applied to right knee, in conjunction with estim. (no adverse effects noted) Goal: muscle re education, pain control, reduce spasms, improve ROM  Patient response to treatment: Patient required assistance with all exercises with pain reported with all exercises. Improved flexibility in right knee with repeated ROM exercises and mobilization. Demonstrated improved quad control with estim        PT Education - 10/05/16 1711    Education provided Yes   Education Details HEP: continue to work on quad sets, ROM and functional movement of right LE   Person(s) Educated Patient   Methods Explanation;Demonstration;Verbal cues   Comprehension Verbalized understanding;Returned demonstration;Verbal cues required  PT Long Term Goals - 08/20/16 1733      PT LONG TERM GOAL #1   Title Patient will improve AAROM right knee up to 90 degrees by 09/16/2016 to allow him to sit and walk with more normal gait pattern and to allow decreased pain in knee   Baseline AAROM right knee 0-10 degrees flexion with pain   Status New      PT LONG TERM GOAL #2   Title patient will improve LEFS to 25/80 by 09/20/16 to demonstrate improving function with daily activities    Baseline LEFS 0/80   Status New     PT LONG TERM GOAL #3   Title patient will have pain level max of 6/10 by 10/20/2016 to allow improved function with moving, sitting and walking   Baseline pain level max 10/10 with aggravating activities   Status New     PT LONG TERM GOAL #4   Title imrpoved LEFS to 50/80 or better by 11/13/2016 indicating improved function with daily tasks/walking/personal care   Baseline LEFS 0/80   Status New     PT LONG TERM GOAL #5   Title patient will be able to walk with least restrictive device safely on level surfaces and stairs by 11/13/2016 to improve function with household and community activities   Baseline walking with rolling walker, WBAT RLE (non weight bearing due to pain)   Status New     Additional Long Term Goals   Additional Long Term Goals Yes     PT LONG TERM GOAL #6   Title patient will be independent with home program for pain control, flexibiltiy, strength for right LE by discharge 11/13/16 in order to continue to improve function with self management   Baseline requires assistance and guidance for all exercises, limited knowledge of appropriate pain control strategies, progression of exercises    Status New               Plan - 10/05/16 1714    Clinical Impression Statement Progressing with improving ROM and decreasing pain. Continues with weakness, stiffness of knee joint, decreased soft tissue and joint mobility and limitations with walking and transfers.    Rehab Potential Good   PT Frequency 3x / week   PT Duration 12 weeks   PT Treatment/Interventions Electrical Stimulation;Gait training;Moist Heat;Therapeutic activities;Therapeutic exercise;Balance training;Neuromuscular re-education;Patient/family education;Passive range of motion;Scar mobilization;Manual techniques   PT Next Visit Plan  modalities for pain control, muscle spasms, manual soft tissue mobilization, therapeutic exercise for ROM, gait   PT Home Exercise Plan ROM, quad sets every hour, foot intrinsic exercises, pain control, hip/knee flexion with assistance, weight shifting side to side in standing      Patient will benefit from skilled therapeutic intervention in order to improve the following deficits and impairments:  Decreased strength, Decreased balance, Hypomobility, Impaired flexibility, Pain, Impaired perceived functional ability, Decreased activity tolerance, Decreased endurance, Increased muscle spasms, Difficulty walking, Decreased range of motion  Visit Diagnosis: Pain in right leg  Difficulty in walking, not elsewhere classified  Other muscle spasm  Muscle weakness (generalized)  Stiffness of right knee, not elsewhere classified     Problem List There are no active problems to display for this patient.   Carl BestBrooks, Chevis Weisensel L 10/06/2016, 7:29 PM  [place] [facility] [address] [place], [place], [address] Phone: [phone]   Fax:  [phone]  Name: Peter BrunsMatthew T Stanley MRN: [id number] Date of Birth: September 16, 1981

## 2016-10-07 ENCOUNTER — Encounter: Admitting: Physical Therapy

## 2016-10-07 NOTE — Therapy (Signed)
[place] [facility] [address] [place], [address] Phone: [phone]   Fax:  [phone]  Physical Therapy Treatment  Patient Details  Name: Peter Stanley MRN: [id number] Date of Birth: 08-27-1981 Referring Provider: Abram Sander MD  Encounter Date: 10/06/2016      PT End of Session - 10/06/16 1943    Visit Number 18   Number of Visits 36   Date for PT Re-Evaluation 11/13/16   PT Start Time 1811   PT Stop Time 1855   PT Time Calculation (min) 44 min   Activity Tolerance Patient limited by pain;Patient limited by fatigue   Behavior During Therapy [facility] for tasks assessed/performed      Past Medical History:  Diagnosis Date  . Allergy   . Anxiety     History reviewed. No pertinent surgical history.  There were no vitals filed for this visit.      Subjective Assessment - 10/06/16 1839    Subjective Patient continues to see improvement with strength and flexibiltiy in right knee/LE. He is eager to walk with crutches now.    Limitations Sitting;Standing;Walking;House hold activities;Other (comment)   Patient Stated Goals to be able to perform personal care activites and return to normal activites without limitaitons (prior level of function)   Currently in Pain? Yes   Pain Score 5    Pain Location Knee   Pain Orientation Right   Pain Descriptors / Indicators Aching;Tightness   Pain Type Acute pain;Surgical pain  05/30/2016   Pain Onset More than a month ago   Pain Frequency Constant      Objective:  Gait: ambulating with 2 wheeled walker PWBRLE, holding right LE off floor  Palpation: Decreased soft tissue mobility along right thigh,and just proximal to patella, anterior thigh with moderatepain on palpation ofquadriceps spasms  AAROM: right knee flexion 0-50 degrees: sitting at edge of treatment table post treatment   Treatment:  Manual therapy: STM right quadriceps, anterior  thighgoal: to improve mobility/decrease pain with patient supine partially reclined and right LE on pillow Patellar mobilization with patella mobilizer: distraction with inferior, medial, lateral glides x 3 reps Tibia/femoral joint mobilization PA glides to increase knee flexion grade 1-2 multiple sets 10 seconds   Therapeutic exercise: patient performed with assistance, verbal and tactile cues of therapist: Hip and knee flexion with assistance of therapistx 5 reps knee extension, flexion with assistance long sitting x 10 sitting knee flexion x 2-3 reps with foot on floor with lowering table to increase knee flexion   Modalities:  Electrical stimulation; Russian stim. 10/10 cycle right VMO/quadriceps, high volt estim. Clinical protocol for muscle spasms with electrodes applied to lateral thigh over spasms, intensity to tolerance  x .With patient in reclined position with pillow under right knee/LE; Moist heat applied to right knee, in conjunction with estim. (no adverse effects noted) Goal: muscle re education, pain control, reduce spasms, improve ROM  Patient response to treatment: Patient with improved AAROM right knee flexion to 50 degrees following manual therapy/exercise. Patient requires assistance with all exercises. Pain increased with repeated knee flexion therefore limited repetitions. Patient demonstrated improved quad control with estim         PT Education - 10/06/16 1941    Education provided Yes   Education Details HEP; work on ROM with ball under foot.    Person(s) Educated Patient   Methods Explanation;Demonstration;Verbal cues   Comprehension Verbalized understanding;Returned demonstration;Verbal cues required  PT Long Term Goals - 08/20/16 1733      PT LONG TERM GOAL #1   Title Patient will improve AAROM right knee up to 90 degrees by 09/16/2016 to allow him to sit and walk with more normal gait pattern and to allow decreased pain  in knee   Baseline AAROM right knee 0-10 degrees flexion with pain   Status New     PT LONG TERM GOAL #2   Title patient will improve LEFS to 25/80 by 09/20/16 to demonstrate improving function with daily activities    Baseline LEFS 0/80   Status New     PT LONG TERM GOAL #3   Title patient will have pain level max of 6/10 by 10/20/2016 to allow improved function with moving, sitting and walking   Baseline pain level max 10/10 with aggravating activities   Status New     PT LONG TERM GOAL #4   Title imrpoved LEFS to 50/80 or better by 11/13/2016 indicating improved function with daily tasks/walking/personal care   Baseline LEFS 0/80   Status New     PT LONG TERM GOAL #5   Title patient will be able to walk with least restrictive device safely on level surfaces and stairs by 11/13/2016 to improve function with household and community activities   Baseline walking with rolling walker, WBAT RLE (non weight bearing due to pain)   Status New     Additional Long Term Goals   Additional Long Term Goals Yes     PT LONG TERM GOAL #6   Title patient will be independent with home program for pain control, flexibiltiy, strength for right LE by discharge 11/13/16 in order to continue to improve function with self management   Baseline requires assistance and guidance for all exercises, limited knowledge of appropriate pain control strategies, progression of exercises    Status New               Plan - 10/06/16 1900    Clinical Impression Statement Progressing with improving ROM right knee flexion from 40 to 50 degrees, decreasing pain and improving strength. He is now able to perform SLR and transfer on/off treatment table without assistance for right LE. He continues with weakness, stiffness in right knee and decreased quad soft tissue mobility with spasms. He is slowly progressing with ROM and continues to require AD to walk. He will benefit from additional physical therapy  intervention to achieve independence with walking.    Rehab Potential Good   PT Frequency 3x / week   PT Duration 12 weeks   PT Treatment/Interventions Electrical Stimulation;Gait training;Moist Heat;Therapeutic activities;Therapeutic exercise;Balance training;Neuromuscular re-education;Patient/family education;Passive range of motion;Scar mobilization;Manual techniques   PT Next Visit Plan modalities for pain control, muscle spasms, manual soft tissue mobilization, therapeutic exercise for ROM, gait   PT Home Exercise Plan ROM, quad sets every hour, foot intrinsic exercises, pain control, hip/knee flexion with assistance, weight shifting side to side in standing      Patient will benefit from skilled therapeutic intervention in order to improve the following deficits and impairments:  Decreased strength, Decreased balance, Hypomobility, Impaired flexibility, Pain, Impaired perceived functional ability, Decreased activity tolerance, Decreased endurance, Increased muscle spasms, Difficulty walking, Decreased range of motion  Visit Diagnosis: Pain in right leg  Difficulty in walking, not elsewhere classified  Other muscle spasm  Muscle weakness (generalized)  Stiffness of right knee, not elsewhere classified     Problem List There are no active problems to display for  this patient.   Beacher MayBrooks, Marie PT 10/07/2016, 9:00 PM  [place] [facility] [address] [place], [place], [address] Phone: [phone]   Fax:  [phone]  Name: Fabienne BrunsMatthew T Wank MRN: [id number] Date of Birth: 08-07-1981

## 2016-10-12 ENCOUNTER — Encounter: Payer: Self-pay | Admitting: Physical Therapy

## 2016-10-12 ENCOUNTER — Ambulatory Visit: Payer: Self-pay | Admitting: Physical Therapy

## 2016-10-12 DIAGNOSIS — M62838 Other muscle spasm: Secondary | ICD-10-CM

## 2016-10-12 DIAGNOSIS — R262 Difficulty in walking, not elsewhere classified: Secondary | ICD-10-CM

## 2016-10-12 DIAGNOSIS — M6281 Muscle weakness (generalized): Secondary | ICD-10-CM

## 2016-10-12 DIAGNOSIS — M79604 Pain in right leg: Secondary | ICD-10-CM

## 2016-10-12 DIAGNOSIS — M25661 Stiffness of right knee, not elsewhere classified: Secondary | ICD-10-CM

## 2016-10-12 NOTE — Therapy (Signed)
[place] [facility] [address] [place], [place], [address] Phone: [phone]   Fax:  [phone]  Physical Therapy Treatment  Patient Details  Name: Peter Stanley MRN: [id number] Date of Birth: 1980/11/24 Referring Provider: Abram Sanderpatterson, jennifer MD  Encounter Date: 10/12/2016      PT End of Session - 10/12/16 1842    Visit Number 19   Number of Visits 36   Date for PT Re-Evaluation 11/13/16   PT Start Time 1820   PT Stop Time 1904   PT Time Calculation (min) 44 min   Activity Tolerance Patient limited by pain;Patient limited by fatigue   Behavior [facility] for tasks assessed/performed      Past Medical History:  Diagnosis Date  . Allergy   . Anxiety     History reviewed. No pertinent surgical history.  There were no vitals filed for this visit.      Subjective Assessment - 10/12/16 1837    Subjective Patient continues to see improvement with strength and flexibiltiy in right knee/LE. He reports he is now able to put more weight on his right LE with walking and is eager to use crutches.    Limitations Sitting;Standing;Walking;House hold activities;Other (comment)   Patient Stated Goals to be able to perform personal care activites and return to normal activites without limitaitons (prior level of function)   Currently in Pain? Yes  only when moving it and ranges up to 8/10   Pain Score 5    Pain Location Knee   Pain Orientation Right   Pain Descriptors / Indicators Aching   Pain Type Acute pain;Surgical pain  05/30/2016   Pain Onset More than a month ago   Pain Frequency Constant      Gait: ambulating with 2 wheeled walker PWBRLE with cuing to maintain correct sequence and alignment of feet with walker for balance and safety  Palpation: Decreased soft tissue mobility along right thigh,and just proximal to patella, anterior thigh with moderatepain on palpation ofquadriceps spasms      Treatment:  Manual therapy: STM right quadriceps, anterior thighgoal: to improve mobility/decrease pain with patient supine partially reclined and right LE on pillow Tibia/femoral joint mobilization PA glides to increase knee flexion grade 1-2 multiple sets 10 seconds   Therapeutic exercise: patient performed with assistance, verbal and tactile cues of therapist: Hip and knee flexion with assistance of therapistx 5 reps knee extension, flexion with assistance long sitting x 10  Modalities:  Electrical stimulation; Russian stim. 10/10 cycle right VMO/quadriceps, high volt estim. Clinical protocol for muscle spasms with electrodes applied to lateral thigh over spasms, intensity to tolerance  x 15min.With patient in reclined position with pillow under right knee/LE; Moist heat applied to right knee, in conjunction with estim. (no adverse effects noted) Goal: muscle re education, pain control, reduce spasms, improve ROM  NuStep x 8 min. Level #2 with assistance to support right LE to get on/off   Patient response to treatment: Patient with improved ROM, flexibility in right knee following manual joint mobilization and STM. He requires assistance to perform most exercises and to get on/off Nu step. Patient demonstrated improved quad control with estim         PT Education - 10/12/16 1841    Education provided Yes   Education Details HEP: continue to work on [employer]   Person(s) Educated Patient   Methods Explanation;Demonstration;Verbal cues   Comprehension Verbalized understanding;Returned demonstration;Verbal cues required  PT Long Term Goals - 08/20/16 1733      PT LONG TERM GOAL #1   Title Patient will improve AAROM right knee up to 90 degrees by 09/16/2016 to allow him to sit and walk with more normal gait pattern and to allow decreased pain in knee   Baseline AAROM right knee 0-10 degrees flexion with pain   Status New     PT LONG TERM GOAL #2    Title patient will improve LEFS to 25/80 by 09/20/16 to demonstrate improving function with daily activities    Baseline LEFS 0/80   Status New     PT LONG TERM GOAL #3   Title patient will have pain level max of 6/10 by 10/20/2016 to allow improved function with moving, sitting and walking   Baseline pain level max 10/10 with aggravating activities   Status New     PT LONG TERM GOAL #4   Title imrpoved LEFS to 50/80 or better by 11/13/2016 indicating improved function with daily tasks/walking/personal care   Baseline LEFS 0/80   Status New     PT LONG TERM GOAL #5   Title patient will be able to walk with least restrictive device safely on level surfaces and stairs by 11/13/2016 to improve function with household and community activities   Baseline walking with rolling walker, WBAT RLE (non weight bearing due to pain)   Status New     Additional Long Term Goals   Additional Long Term Goals Yes     PT LONG TERM GOAL #6   Title patient will be independent with home program for pain control, flexibiltiy, strength for right LE by discharge 11/13/16 in order to continue to improve function with self management   Baseline requires assistance and guidance for all exercises, limited knowledge of appropriate pain control strategies, progression of exercises    Status New               Plan - 10/12/16 1842    Clinical Impression Statement Progressing with improving ROM that allows him to get in front seat of his truck. he continues with pain, stiffness and weakness that limits full function with walking and daily activities.    Rehab Potential Good   PT Frequency 3x / week   PT Duration 12 weeks   PT Treatment/Interventions Electrical Stimulation;Gait training;Moist Heat;Therapeutic activities;Therapeutic exercise;Balance training;Neuromuscular re-education;Patient/family education;Passive range of motion;Scar mobilization;Manual techniques   PT Next Visit Plan modalities for pain  control, muscle spasms, manual soft tissue mobilization, therapeutic exercise for ROM, gait   PT Home Exercise Plan ROM, quad sets every hour, foot intrinsic exercises, pain control, hip/knee flexion with assistance, weight shifting side to side in standing      Patient will benefit from skilled therapeutic intervention in order to improve the following deficits and impairments:  Decreased strength, Decreased balance, Hypomobility, Impaired flexibility, Pain, Impaired perceived functional ability, Decreased activity tolerance, Decreased endurance, Increased muscle spasms, Difficulty walking, Decreased range of motion  Visit Diagnosis: Pain in right leg  Difficulty in walking, not elsewhere classified  Other muscle spasm  Muscle weakness (generalized)  Stiffness of right knee, not elsewhere classified     Problem List There are no active problems to display for this patient.   Beacher MayBrooks, Taquisha Phung PT 10/13/2016, 9:29 PM   [facility] [address] [place], [place], [address] Phone: [phone]   Fax:  [phone]  Name: Peter BrunsMatthew T Stanley MRN: [id number] Date of Birth:  11/17/1980    

## 2016-10-13 ENCOUNTER — Ambulatory Visit: Payer: Self-pay | Admitting: Physical Therapy

## 2016-10-14 ENCOUNTER — Ambulatory Visit: Payer: Self-pay | Admitting: Physical Therapy

## 2016-10-14 ENCOUNTER — Encounter: Payer: Self-pay | Admitting: Physical Therapy

## 2016-10-14 DIAGNOSIS — M25661 Stiffness of right knee, not elsewhere classified: Secondary | ICD-10-CM

## 2016-10-14 DIAGNOSIS — M62838 Other muscle spasm: Secondary | ICD-10-CM

## 2016-10-14 DIAGNOSIS — R262 Difficulty in walking, not elsewhere classified: Secondary | ICD-10-CM

## 2016-10-14 DIAGNOSIS — M6281 Muscle weakness (generalized): Secondary | ICD-10-CM

## 2016-10-14 DIAGNOSIS — M79604 Pain in right leg: Secondary | ICD-10-CM

## 2016-10-15 NOTE — Therapy (Signed)
[place] [facility] [address] , [place], [address] Phone: [phone]   Fax:  [phone]  Physical Therapy Treatment  Patient Details  Name: Peter Stanley MRN: [id number] Date of Birth: 1981/09/20 Referring Provider: Abram Sanderpatterson, jennifer MD  Encounter Date: 10/14/2016      PT End of Session - 10/14/16 1901    Visit Number 20   Number of Visits 36   Date for PT Re-Evaluation 11/13/16   PT Start Time 1835   PT Stop Time 1920   PT Time Calculation (min) 45 min   Activity Tolerance Patient limited by pain;Patient limited by fatigue   Behavior During Therapy [facility] for tasks assessed/performed      Past Medical History:  Diagnosis Date  . Allergy   . Anxiety     History reviewed. No pertinent surgical history.  There were no vitals filed for this visit.      Subjective Assessment - 10/14/16 1858    Subjective Patient continues to see improvement with ability to roll over in bed yesterday to prone.    Limitations Sitting;Standing;Walking;House hold activities;Other (comment)   Patient Stated Goals to be able to perform personal care activites and return to normal activites without limitaitons (prior level of function)   Currently in Pain? Other (Comment)  only with sitting too long, moving leg in/out of bed etc.       Objective: Gait: ambulating with 2 wheeled walker PWBRLE with cuing to maintain correct sequence and alignment of feet with walker for balance and safety  Palpation: Decreased soft tissue mobility along right thigh,and just proximal to patella, anterior thigh with moderatepain on palpation ofquadriceps spasms  AAROM: left knee flexion 0-50/60 at end of session   Treatment:  Manual therapy: STM right quadriceps, anterior thighgoal: to improve mobility/decrease pain with patient supine partially reclined and right LE on pillow Tibia/femoral joint mobilization PA glides to  increase knee flexion grade 1-2 multiple sets 10 seconds   Therapeutic exercise: patient performed with assistance, verbal and tactile cues of therapist: Hip and knee flexion with assistance of therapistx 5 reps knee extension, flexion with assistance long sitting and sitting over edge of table x 10  Modalities:  Electrical stimulation; Russian stim. 10/10 cycle right VMO/quadriceps, high volt estim. Clinical protocol for muscle spasms with electrodes applied to lateral thigh over spasms, intensity to tolerance  x 20min.With patient in reclined position with pillow under right knee/LE; Moist heat applied to right knee, in conjunction with estim. (no adverse effects noted) Goal: muscle re education, pain control, reduce spasms, improve ROM  NuStep x 4 min. Level #2 with assistance to support right LE to get on/off   Patient response to treatment: Patient with improved ROM into right knee flexion by 10 degrees following estim. STM and ROM exercises,  He requires assistance to perform most exercises and to get on/off Nu step. Patient demonstratedimproved quad control with estim         PT Education - 10/14/16 1901    Education provided Yes   Education Details HEP: continue with ROM, SLR and quad setting   Person(s) Educated Patient   Methods Explanation;Demonstration;Verbal cues   Comprehension Verbalized understanding;Returned demonstration;Verbal cues required             PT Long Term Goals - 08/20/16 1733      PT LONG TERM GOAL #1   Title Patient will improve AAROM right knee up to 90 degrees by 09/16/2016 to allow  him to sit and walk with more normal gait pattern and to allow decreased pain in knee   Baseline AAROM right knee 0-10 degrees flexion with pain   Status New     PT LONG TERM GOAL #2   Title patient will improve LEFS to 25/80 by 09/20/16 to demonstrate improving function with daily activities    Baseline LEFS 0/80   Status New     PT LONG TERM GOAL  #3   Title patient will have pain level max of 6/10 by 10/20/2016 to allow improved function with moving, sitting and walking   Baseline pain level max 10/10 with aggravating activities   Status New     PT LONG TERM GOAL #4   Title imrpoved LEFS to 50/80 or better by 11/13/2016 indicating improved function with daily tasks/walking/personal care   Baseline LEFS 0/80   Status New     PT LONG TERM GOAL #5   Title patient will be able to walk with least restrictive device safely on level surfaces and stairs by 11/13/2016 to improve function with household and community activities   Baseline walking with rolling walker, WBAT RLE (non weight bearing due to pain)   Status New     Additional Long Term Goals   Additional Long Term Goals Yes     PT LONG TERM GOAL #6   Title patient will be independent with home program for pain control, flexibiltiy, strength for right LE by discharge 11/13/16 in order to continue to improve function with self management   Baseline requires assistance and guidance for all exercises, limited knowledge of appropriate pain control strategies, progression of exercises    Status New               Plan - 10/14/16 1902    Clinical Impression Statement Patient is progressing with ability to move with less difficulty and is still having stiffness in right knee and weakness that limits ability to walk.    Rehab Potential Good   PT Frequency 3x / week   PT Duration 12 weeks   PT Treatment/Interventions Electrical Stimulation;Gait training;Moist Heat;Therapeutic activities;Therapeutic exercise;Balance training;Neuromuscular re-education;Patient/family education;Passive range of motion;Scar mobilization;Manual techniques   PT Next Visit Plan modalities for pain control, muscle spasms, manual soft tissue mobilization, therapeutic exercise for ROM, gait   PT Home Exercise Plan ROM, quad sets every hour, foot intrinsic exercises, pain control, hip/knee flexion with  assistance, weight shifting side to side in standing      Patient will benefit from skilled therapeutic intervention in order to improve the following deficits and impairments:  Decreased strength, Decreased balance, Hypomobility, Impaired flexibility, Pain, Impaired perceived functional ability, Decreased activity tolerance, Decreased endurance, Increased muscle spasms, Difficulty walking, Decreased range of motion  Visit Diagnosis: Pain in right leg  Difficulty in walking, not elsewhere classified  Other muscle spasm  Muscle weakness (generalized)  Stiffness of right knee, not elsewhere classified     Problem List There are no active problems to display for this patient.   Beacher MayBrooks, Shawnice Tilmon PT 10/15/2016, 7:54 PM  [facility] [address] [place], [place], [address] Phone: [phone]   Fax:  [phone]  Name: Peter Stanley MRN: [id number] Date of Birth: Sep 15, 1981

## 2016-10-16 ENCOUNTER — Ambulatory Visit: Payer: Self-pay | Admitting: Physical Therapy

## 2016-10-19 ENCOUNTER — Ambulatory Visit: Payer: Self-pay | Admitting: Physical Therapy

## 2016-10-20 ENCOUNTER — Ambulatory Visit: Payer: Self-pay | Admitting: Physical Therapy

## 2016-10-20 ENCOUNTER — Encounter: Payer: Self-pay | Admitting: Physical Therapy

## 2016-10-20 ENCOUNTER — Ambulatory Visit: Payer: Self-pay | Attending: Orthopedic Surgery | Admitting: Physical Therapy

## 2016-10-20 DIAGNOSIS — M79604 Pain in right leg: Secondary | ICD-10-CM | POA: Insufficient documentation

## 2016-10-20 DIAGNOSIS — M62838 Other muscle spasm: Secondary | ICD-10-CM | POA: Insufficient documentation

## 2016-10-20 DIAGNOSIS — R262 Difficulty in walking, not elsewhere classified: Secondary | ICD-10-CM | POA: Insufficient documentation

## 2016-10-20 DIAGNOSIS — M6281 Muscle weakness (generalized): Secondary | ICD-10-CM | POA: Insufficient documentation

## 2016-10-20 DIAGNOSIS — M25661 Stiffness of right knee, not elsewhere classified: Secondary | ICD-10-CM | POA: Insufficient documentation

## 2016-10-20 NOTE — Therapy (Signed)
[place] [facility] [address] [place], [place], [address] Phone: [phone]   Fax:  [phone]  Physical Therapy Treatment  Patient Details  Name: Peter Stanley MRN: [id number] Date of Birth: [birth year] Referring Provider: Abram Sanderpatterson, jennifer MD  Encounter Date: 10/20/2016      PT End of Session - 10/20/16 1747    Visit Number 21   Number of Visits 36   Date for PT Re-Evaluation 11/13/16   PT Start Time 1706   PT Stop Time 1750   PT Time Calculation (min) 44 min   Activity Tolerance Patient limited by pain;Patient limited by fatigue   Behavior During Therapy [facility] for tasks assessed/performed      Past Medical History:  Diagnosis Date  . Allergy   . Anxiety     History reviewed. No pertinent surgical history.  There were no vitals filed for this visit.      Subjective Assessment - 10/20/16 1710    Subjective Patient reports increased pain in right LE, felt a pop in his knee over the weekend and felt better and then woke up with increased pain in knee. He reports increased spasms since missing a session last week. He is using crutches for walking now and feels much better with walking.    Limitations Sitting;Standing;Walking;House hold activities;Other (comment)   Patient Stated Goals to be able to perform personal care activites and return to normal activites without limitaitons (prior level of function)   Currently in Pain? Other (Comment)  with movement, bending and sitting for prolonged periods of time      Objective: Gait: ambulating with 2 axillary crutches (required adjusting for hand supports)PWBRLE with cuing to maintain correct sequence and alignment of feet for balance and safety Palpation: Decreased soft tissue mobility along right thigh,and just proximal to patella, anterior thigh with moderatepain on palpation ofquadriceps spasms     Treatment:  Manual therapy: STM right  quadriceps, anterior thighgoal: to improve mobility/decrease pain with patient supine partially reclined and right LE on pillow Tibia/femoral joint mobilization PA glides to increase knee flexion grade 1-2 multiple sets 10 seconds   Therapeutic exercise: patient performed with assistance, verbal and tactile cues of therapist: Hip and knee flexion in supine lying and sitting with assistance of therapistx 5 reps knee extension, flexion with assistance long sitting and sitting over edge of table x 10  Modalities:  Electrical stimulation; Russian stim. 10/10 cycle right VMO/quadriceps, high volt estim. Clinical protocol for muscle spasms with electrodes applied to lateral thigh over spasms, intensity to tolerance  x 20min.With patient in reclined position with pillow under right knee/LE; Moist heat applied to right knee, in conjunction with estim. (no adverse effects noted) Goal: muscle re education, pain control, reduce spasms, improve ROM  NuStep x 5 min. Level #2 with assistance to support right LE to get on/off (performed independently prior to exercise)  Patient response to treatment: Patient with improved soft tissue elasticity following manual therapy STM, increased pain with ROM exercises, no worse following session. Improved gait pattern with crutches following adjustment and instruction.      PT Education - 10/20/16 1747    Education provided Yes   Education Details HEP: continue to work on bending in sitting and quad control/setting and good gait pattern with increasing hip/knee flexion and push off   Person(s) Educated Patient   Methods Explanation;Demonstration;Verbal cues   Comprehension Verbalized understanding;Returned demonstration;Verbal cues required  PT Long Term Goals - 08/20/16 1733      PT LONG TERM GOAL #1   Title Patient will improve AAROM right knee up to 90 degrees by 09/16/2016 to allow him to sit and walk with more normal gait pattern  and to allow decreased pain in knee   Baseline AAROM right knee 0-10 degrees flexion with pain   Status New     PT LONG TERM GOAL #2   Title patient will improve LEFS to 25/80 by 09/20/16 to demonstrate improving function with daily activities    Baseline LEFS 0/80   Status New     PT LONG TERM GOAL #3   Title patient will have pain level max of 6/10 by 10/20/2016 to allow improved function with moving, sitting and walking   Baseline pain level max 10/10 with aggravating activities   Status New     PT LONG TERM GOAL #4   Title imrpoved LEFS to 50/80 or better by 11/13/2016 indicating improved function with daily tasks/walking/personal care   Baseline LEFS 0/80   Status New     PT LONG TERM GOAL #5   Title patient will be able to walk with least restrictive device safely on level surfaces and stairs by 11/13/2016 to improve function with household and community activities   Baseline walking with rolling walker, WBAT RLE (non weight bearing due to pain)   Status New     Additional Long Term Goals   Additional Long Term Goals Yes     PT LONG TERM GOAL #6   Title patient will be independent with home program for pain control, flexibiltiy, strength for right LE by discharge 11/13/16 in order to continue to improve function with self management   Baseline requires assistance and guidance for all exercises, limited knowledge of appropriate pain control strategies, progression of exercises    Status New               Plan - 10/20/16 1748    Clinical Impression Statement Patient is progressing with improving gait pattern with crutches, less restrictive assisted device. He continues with spasms, pain and limited motion in hip and knee and decreased strength and will benefit from continued physical therapy intervention to achieve goals.    Rehab Potential Good   PT Frequency 3x / week   PT Duration 12 weeks   PT Treatment/Interventions Electrical Stimulation;Gait training;Moist  Heat;Therapeutic activities;Therapeutic exercise;Balance training;Neuromuscular re-education;Patient/family education;Passive range of motion;Scar mobilization;Manual techniques   PT Next Visit Plan modalities for pain control, muscle spasms, manual soft tissue mobilization, therapeutic exercise for ROM, gait   PT Home Exercise Plan ROM, quad sets every hour, foot intrinsic exercises, pain control, hip/knee flexion with assistance, weight shifting side to side in standing      Patient will benefit from skilled therapeutic intervention in order to improve the following deficits and impairments:  Decreased strength, Decreased balance, Hypomobility, Impaired flexibility, Pain, Impaired perceived functional ability, Decreased activity tolerance, Decreased endurance, Increased muscle spasms, Difficulty walking, Decreased range of motion  Visit Diagnosis: Pain in right leg  Difficulty in walking, not elsewhere classified  Other muscle spasm  Muscle weakness (generalized)     Problem List There are no active problems to display for this patient.   Beacher MayBrooks, Marie PT 10/21/2016, 6:28 PM  [place] [facility] [address] [place], [place], [address] Phone: [phone]   Fax:  [phone]  Name: Peter Stanley MRN: [id number] Date of Birth:  12/01/1980    

## 2016-10-21 ENCOUNTER — Ambulatory Visit: Payer: Self-pay | Admitting: Physical Therapy

## 2016-10-22 ENCOUNTER — Ambulatory Visit: Payer: Self-pay | Admitting: Physical Therapy

## 2016-10-23 ENCOUNTER — Ambulatory Visit: Payer: Self-pay | Admitting: Physical Therapy

## 2016-10-26 ENCOUNTER — Ambulatory Visit: Payer: Self-pay | Admitting: Physical Therapy

## 2016-10-26 ENCOUNTER — Encounter: Payer: Self-pay | Admitting: Physical Therapy

## 2016-10-26 DIAGNOSIS — M25661 Stiffness of right knee, not elsewhere classified: Secondary | ICD-10-CM

## 2016-10-26 DIAGNOSIS — R262 Difficulty in walking, not elsewhere classified: Secondary | ICD-10-CM

## 2016-10-26 DIAGNOSIS — M79604 Pain in right leg: Secondary | ICD-10-CM

## 2016-10-26 DIAGNOSIS — M62838 Other muscle spasm: Secondary | ICD-10-CM

## 2016-10-26 DIAGNOSIS — M6281 Muscle weakness (generalized): Secondary | ICD-10-CM

## 2016-10-27 NOTE — Therapy (Signed)
[place] [facility] [address], [place], [address] Phone: [phone]   Fax:  [phone]  Physical Therapy Treatment  Patient Details  Name: Peter Stanley MRN: [id number] Date of Birth: [birth year] Referring Provider: Abram Sander MD  Encounter Date: 10/26/2016      PT End of Session - 10/26/16 1837    Visit Number 22   Number of Visits 36   Date for PT Re-Evaluation 11/13/16   PT Start Time 1737   PT Stop Time 1828   PT Time Calculation (min) 51 min   Activity Tolerance Patient limited by pain;Patient limited by fatigue   Behavior During Therapy [facility] for tasks assessed/performed      Past Medical History:  Diagnosis Date  . Allergy   . Anxiety     History reviewed. No pertinent surgical history.  There were no vitals filed for this visit.      Subjective Assessment - 10/26/16 1743    Subjective Patient reports he is working on bending right knee and is able to exercise with less difficulty now. He continues with weakness, stiffness and pain in right LE, knee and is not able to walk or perform any activities that require standing or bending. He is beginning to dress himself and is now able to get in and out of the back seat of vehicles without assistance.    Limitations Sitting;Standing;Walking;House hold activities;Other (comment)   Patient Stated Goals to be able to perform personal care activites and return to normal activites without limitaitons (prior level of function)   Currently in Pain? Other (Comment)  bending, moving right LE causes increased pain      Objective: Gait: ambulating with 2 axillary crutches PWBRLE Palpation: Decreased soft tissue mobility along right thigh,and just proximal to patella, anterior thigh with moderatepain on palpation ofquadriceps spasms  AAROM right knee flexion post treatment: 0-50 degrees with effort (decreased following STM just superior to  patella)    Treatment:  Manual therapy: STM right quadriceps, anterior thigh, superior to patellagoal: to improve mobility/decrease pain with patient supine partially reclined or sitting  Tibia/femoral joint mobilization PA glides to increase knee flexion grade 1-2/3 multiple sets 10 seconds Patella mobilization/distraction with glides superior/inferior and medial/lateral x 3 sets   Therapeutic exercise: patient performed with assistance, verbal and tactile cues of therapist: knee extension, flexion with assistance long sitting and sitting over edge of tablex 10 Isometric knee flexion multi angle to improve knee flexion multiple sets  Modalities:  Electrical stimulation; Russian stim. 10/10 cycle right VMO/quadriceps, high volt estim. Clinical protocol for muscle spasms with electrodes applied to lateral thigh over spasms, intensity to tolerance; 2# weight on ankle for knee extension each cycle during estim.x .With patient in reclined position with pillow under right knee/LE; Moist heat applied to right knee, in conjunction with estim. (no adverse effects noted) Goal: muscle re education, pain control, reduce spasms, improve ROM  Patient response to treatment: Patient demonstrated improved knee flexion with repetition and STM superior to patella. Decreased pain reported to mild with knee flexion following STM to quadriceps.ncreased pain with ROM exercises, no worse following session.          PT Education - 10/26/16 1836    Education provided Yes   Education Details HEP: work on self massage and ROM right knee to improve ROM with less pain/difficulty   Person(s) Educated Patient   Methods Explanation;Demonstration;Verbal cues   Comprehension Verbalized understanding;Returned demonstration;Verbal cues required  PT Long Term Goals - 10/26/16 1832      PT LONG TERM GOAL #1   Title Patient will improve AAROM right knee up to 90 degrees by 11/13/2016  to allow him to sit and walk with more normal gait pattern and to allow decreased pain in knee   Baseline AAROM right knee 0-10 degrees flexion with pain; 10/26/16 0-50 degreed flexion   Status Revised     PT LONG TERM GOAL #2   Title patient will improve LEFS to 40/80 by 11/13/16 to demonstrate improving function with daily activities    Baseline LEFS 0/80; 10/26/16 deferred   Status Revised     PT LONG TERM GOAL #3   Title patient will have pain level max of 6/10 by 11/13/2016 to allow improved function with moving, sitting and walking   Baseline pain level max 10/10 with aggravating activities; 10/26/16 no pain reported unless moving right leg/knee then increases to 8-10/10   Status Revised     PT LONG TERM GOAL #4   Title imrpoved LEFS to 50/80 or better by 11/13/2016 indicating improved function with daily tasks/walking/personal care   Baseline LEFS 0/80   Status On-going     PT LONG TERM GOAL #5   Title patient will be able to walk with least restrictive device safely on level surfaces and stairs by 11/13/2016 to improve function with household and community activities   Baseline walking with rolling walker, WBAT RLE (non weight bearing due to pain)   Status On-going     PT LONG TERM GOAL #6   Title patient will be independent with home program for pain control, flexibiltiy, strength for right LE by discharge 11/13/16 in order to continue to improve function with self management   Baseline requires assistance and guidance for all exercises, limited knowledge of appropriate pain control strategies, progression of exercises    Status On-going               Plan - 10/26/16 1837    Clinical Impression Statement Patient is progressing with current treatment of manual therapy, exercise and electrical stimulaiton. He continues with markedly limited ROM and strength in right LE and is not able to ambulate independently without AD and will require additional physical therapy  intervention to achived full ROM, strength and function for return to maximal functional use of right LE.    Rehab Potential Good   PT Frequency 3x / week   PT Duration 12 weeks   PT Treatment/Interventions Electrical Stimulation;Gait training;Moist Heat;Therapeutic activities;Therapeutic exercise;Balance training;Neuromuscular re-education;Patient/family education;Passive range of motion;Scar mobilization;Manual techniques   PT Next Visit Plan modalities for pain control, muscle spasms, manual soft tissue mobilization, therapeutic exercise for ROM, gait   PT Home Exercise Plan ROM, quad sets every hour, foot intrinsic exercises, pain control, hip/knee flexion with assistance, weight shifting side to side in standing      Patient will benefit from skilled therapeutic intervention in order to improve the following deficits and impairments:  Decreased strength, Decreased balance, Hypomobility, Impaired flexibility, Pain, Impaired perceived functional ability, Decreased activity tolerance, Decreased endurance, Increased muscle spasms, Difficulty walking, Decreased range of motion  Visit Diagnosis: Pain in right leg  Difficulty in walking, not elsewhere classified  Other muscle spasm  Muscle weakness (generalized)  Stiffness of right knee, not elsewhere classified     Problem List There are no active problems to display for this patient.   Beacher MayBrooks, Chandler Stofer PT 10/27/2016, 12:47 PM  [facility] [address] [place], [place], [address] Phone: [phone]   Fax:  [phone]  Name: Fabienne BrunsMatthew T Basley MRN: [id number] Date of Birth: [birth year]

## 2016-10-28 ENCOUNTER — Ambulatory Visit: Payer: Self-pay | Admitting: Physical Therapy

## 2016-10-28 ENCOUNTER — Encounter: Payer: Self-pay | Admitting: Physical Therapy

## 2016-10-28 DIAGNOSIS — M62838 Other muscle spasm: Secondary | ICD-10-CM

## 2016-10-28 DIAGNOSIS — R262 Difficulty in walking, not elsewhere classified: Secondary | ICD-10-CM

## 2016-10-28 DIAGNOSIS — M79604 Pain in right leg: Secondary | ICD-10-CM

## 2016-10-28 DIAGNOSIS — M6281 Muscle weakness (generalized): Secondary | ICD-10-CM

## 2016-10-28 NOTE — Therapy (Signed)
[facility] [address] [place], [place], [address] Phone: [phone]   Fax:  [phone]  Physical Therapy Treatment  Patient Details  Name: Peter Stanley MRN: [id number] Date of Birth: January 27, 1981 Referring Provider: Abram Sanderpatterson, jennifer MD  Encounter Date: 10/28/2016      PT End of Session - 10/28/16 1815    Visit Number 23   Number of Visits 36   Date for PT Re-Evaluation 11/13/16   PT Start Time 1736   PT Stop Time 1828   PT Time Calculation (min) 52 min   Activity Tolerance Patient limited by pain;Patient limited by fatigue   Behavior During Therapy [facility] for tasks assessed/performed      Past Medical History:  Diagnosis Date  . Allergy   . Anxiety     History reviewed. No pertinent surgical history.  There were no vitals filed for this visit.      Subjective Assessment - 10/28/16 1738    Subjective Patient reports he has been up most of the day today and working on bending and moving right knee and is doing better with less pain. He feels the STM is helping a great deal.    Limitations Sitting;Standing;Walking;House hold activities;Other (comment)   Patient Stated Goals to be able to perform personal care activites and return to normal activites without limitaitons (prior level of function)   Currently in Pain? Other (Comment)  increased pain in knee with bending and moving right LE      Objective: Gait: ambulating with 2 axillary crutches PWBRLE Palpation: Decreased soft tissue mobility along right thigh,and just proximal to patella   Treatment:  Manual therapy: STM right quadriceps, anterior thigh, superior to patellagoal: to improve mobility/decrease pain with patient supine partially reclined or sitting  Tibia/femoral joint mobilization PA glides to increase knee flexion grade 1-2/3 multiple sets 10 seconds Patella mobilization/distraction with glides superior/inferior and  medial/lateral x 3 sets   Therapeutic exercise: patient performed with assistance, verbal and tactile cues of therapist: knee extension, flexion with assistance long sitting and  over edge of tablex 10 Isometric knee flexion multi angle to improve knee flexion multiple sets  Modalities:  Electrical stimulation; Russian stim. 10/10 cycle right VMO/quadriceps, high volt estim. Clinical protocol for muscle spasms with electrodes applied to lateral thigh over spasms, intensity to tolerance; 2# weight on ankle for knee extension each cycle during estim.x 20min.With patient in reclined position with pillow under right knee/LE; Moist heat applied to right knee, in conjunction with estim. (no adverse effects noted) Goal: muscle re education, pain control, reduce spasms, improve ROM  Patient response to treatment: Patient demonstrated improved flexibility into right knee flexion with repetition and following STM.  Patient with less pain in knee following STM, allowing increased pain free flexion of right knee. improved quad control and ability to quad set with less difficulty following estim.          PT Education - 10/28/16 1808    Education provided Yes   Education Details HEP: continue with self massage and ROM exercises to improve right knee flexion and walking with increasing weight bearing     Person(s) Educated Patient   Methods Explanation;Demonstration;Verbal cues   Comprehension Verbalized understanding;Returned demonstration;Verbal cues required             PT Long Term Goals - 10/26/16 1832      PT LONG TERM GOAL #1   Title Patient will improve AAROM right knee up to  90 degrees by 11/13/2016 to allow him to sit and walk with more normal gait pattern and to allow decreased pain in knee   Baseline AAROM right knee 0-10 degrees flexion with pain; 10/26/16 0-50 degreed flexion   Status Revised     PT LONG TERM GOAL #2   Title patient will improve LEFS to 40/80 by  11/13/16 to demonstrate improving function with daily activities    Baseline LEFS 0/80; 10/26/16 deferred   Status Revised     PT LONG TERM GOAL #3   Title patient will have pain level max of 6/10 by 11/13/2016 to allow improved function with moving, sitting and walking   Baseline pain level max 10/10 with aggravating activities; 10/26/16 no pain reported unless moving right leg/knee then increases to 8-10/10   Status Revised     PT LONG TERM GOAL #4   Title imrpoved LEFS to 50/80 or better by 11/13/2016 indicating improved function with daily tasks/walking/personal care   Baseline LEFS 0/80   Status On-going     PT LONG TERM GOAL #5   Title patient will be able to walk with least restrictive device safely on level surfaces and stairs by 11/13/2016 to improve function with household and community activities   Baseline walking with rolling walker, WBAT RLE (non weight bearing due to pain)   Status On-going     PT LONG TERM GOAL #6   Title patient will be independent with home program for pain control, flexibiltiy, strength for right LE by discharge 11/13/16 in order to continue to improve function with self management   Baseline requires assistance and guidance for all exercises, limited knowledge of appropriate pain control strategies, progression of exercises    Status On-going               Plan - 10/28/16 1851    Clinical Impression Statement Patient continues to demonstrate good carry over between visits with improved ROM and ability to walk with less difficulty. He continues with significant decreased ROM and strength and difficulty with walking without AD and will require additional physical therapy intervention to achieve goals.    Rehab Potential Good   PT Frequency 3x / week   PT Duration 12 weeks   PT Treatment/Interventions Electrical Stimulation;Gait training;Moist Heat;Therapeutic activities;Therapeutic exercise;Balance training;Neuromuscular  re-education;Patient/family education;Passive range of motion;Scar mobilization;Manual techniques   PT Next Visit Plan modalities for pain control, muscle spasms, manual soft tissue mobilization, therapeutic exercise for ROM, gait   PT Home Exercise Plan ROM, quad sets every hour, foot intrinsic exercises, pain control, hip/knee flexion with assistance, weight shifting side to side in standing      Patient will benefit from skilled therapeutic intervention in order to improve the following deficits and impairments:  Decreased strength, Decreased balance, Hypomobility, Impaired flexibility, Pain, Impaired perceived functional ability, Decreased activity tolerance, Decreased endurance, Increased muscle spasms, Difficulty walking, Decreased range of motion  Visit Diagnosis: Pain in right leg  Difficulty in walking, not elsewhere classified  Other muscle spasm  Muscle weakness (generalized)     Problem List There are no active problems to display for this patient.   Beacher MayBrooks, Marie PT 10/28/2016, 6:56 PM  [facility] [address] [place], [place], [address] Phone: [phone]   Fax:  [phone]  Name: Peter Stanley MRN: [id number] Date of Birth: 1981/08/11

## 2016-10-29 ENCOUNTER — Ambulatory Visit: Payer: Self-pay | Admitting: Physical Therapy

## 2016-11-02 ENCOUNTER — Encounter: Payer: Self-pay | Admitting: Physical Therapy

## 2016-11-02 ENCOUNTER — Ambulatory Visit: Payer: Self-pay | Admitting: Physical Therapy

## 2016-11-02 DIAGNOSIS — M79604 Pain in right leg: Secondary | ICD-10-CM

## 2016-11-02 DIAGNOSIS — M62838 Other muscle spasm: Secondary | ICD-10-CM

## 2016-11-02 DIAGNOSIS — R262 Difficulty in walking, not elsewhere classified: Secondary | ICD-10-CM

## 2016-11-03 NOTE — Therapy (Signed)
[place] [facility] [address] [place], [place], [address] Phone: [phone]   Fax:  [phone]  Physical Therapy Treatment  Patient Details  Name: Peter BrunsMatthew T Stanley MRN: [id number] Date of Birth: [birth year] Referring Provider: Abram Sanderpatterson, jennifer MD  Encounter Date: 11/02/2016      PT End of Session - 11/02/16 1805    Visit Number 24   Number of Visits 36   Date for PT Re-Evaluation 11/13/16   PT Start Time 1737   PT Stop Time 1815   PT Time Calculation (min) 38 min   Activity Tolerance Patient limited by pain;Patient limited by fatigue   Behavior During Therapy [facility] for tasks assessed/performed      Past Medical History:  Diagnosis Date  . Allergy   . Anxiety     History reviewed. No pertinent surgical history.  There were no vitals filed for this visit.      Subjective Assessment - 11/02/16 1803    Subjective Patient reports he is working on bending right knee and is able to exercise with less difficulty now. He continues with weakness, stiffness and pain in right LE, knee and reports increased pain when he got out of his father's truck over the weekend and placed full weight through his right knee and he is still recovering from this incident.  He reports increased stiffness in his knee today and feels the joint mobs and STM are helping a great deal to improve pain and flexibility    Limitations Sitting;Standing;Walking;House hold activities;Other (comment)   Patient Stated Goals to be able to perform personal care activites and return to normal activites without limitaitons (prior level of function)   Currently in Pain? No/denies        Objective: Gait: ambulating with 2 axillary crutches PWBRLE Palpation: Decreased soft tissue mobility along right thigh, lateral, proximal regionand just proximal to patella   Treatment:  Manual therapy: STM right quadriceps, anterior thigh, superior to  patellagoal: to improve mobility/decrease pain with patient supine partially reclined or sitting Tibia/femoral joint mobilization PA glides to increase knee flexion grade 1-2/783multiple sets 10 seconds Patella mobilization/distraction with glides superior/inferior and medial/lateral x 3 sets Assisted knee flexion/extension with patient long sitting  Modalities:  Electrical stimulation; Russian stim. 10/10 cycle right VMO/quadriceps, high volt estim. Clinical protocol for muscle spasms with electrodes applied to lateral thigh over spasms, intensity to tolerance; 2# weight on ankle for knee extension each cycle during estim.x 20min.With patient in reclined position with pillow under right knee/LE; Moist heat applied to right knee, in conjunction with estim. (no adverse effects noted) Goal: muscle re education, pain control, reduce spasms, improve ROM  Patient response to treatment: Patient demonstrated improved soft tissue and joint mobility following manual therapy techniques. He improved ability to walk with less difficulty and stiffness in his knee and less pain with weight bearing following session.         PT Education - 11/02/16 1804    Education provided Yes   Education Details HEP: continue with home exercises and self massage for quadriceps.    Person(s) Educated Patient   Methods Explanation;Demonstration;Verbal cues   Comprehension Verbalized understanding;Returned demonstration;Verbal cues required             PT Long Term Goals - 10/26/16 1832      PT LONG TERM GOAL #1   Title Patient will improve AAROM right knee up to 90 degrees by 11/13/2016 to allow him to sit and walk  with more normal gait pattern and to allow decreased pain in knee   Baseline AAROM right knee 0-10 degrees flexion with pain; 10/26/16 0-50 degreed flexion   Status Revised     PT LONG TERM GOAL #2   Title patient will improve LEFS to 40/80 by 11/13/16 to demonstrate improving function with  daily activities    Baseline LEFS 0/80; 10/26/16 deferred   Status Revised     PT LONG TERM GOAL #3   Title patient will have pain level max of 6/10 by 11/13/2016 to allow improved function with moving, sitting and walking   Baseline pain level max 10/10 with aggravating activities; 10/26/16 no pain reported unless moving right leg/knee then increases to 8-10/10   Status Revised     PT LONG TERM GOAL #4   Title imrpoved LEFS to 50/80 or better by 11/13/2016 indicating improved function with daily tasks/walking/personal care   Baseline LEFS 0/80   Status On-going     PT LONG TERM GOAL #5   Title patient will be able to walk with least restrictive device safely on level surfaces and stairs by 11/13/2016 to improve function with household and community activities   Baseline walking with rolling walker, WBAT RLE (non weight bearing due to pain)   Status On-going     PT LONG TERM GOAL #6   Title patient will be independent with home program for pain control, flexibiltiy, strength for right LE by discharge 11/13/16 in order to continue to improve function with self management   Baseline requires assistance and guidance for all exercises, limited knowledge of appropriate pain control strategies, progression of exercises    Status On-going               Plan - 11/02/16 1806    Clinical Impression Statement Patient is progressing steadily with ROM and strength with right LE. He continues with weakness and decreased ROM in right knee that limits function with walking and bending activities and he should continue to improve with additional physical therapy intervention.    Rehab Potential Good   PT Frequency 3x / week   PT Duration 12 weeks   PT Treatment/Interventions Electrical Stimulation;Gait training;Moist Heat;Therapeutic activities;Therapeutic exercise;Balance training;Neuromuscular re-education;Patient/family education;Passive range of motion;Scar mobilization;Manual techniques    PT Next Visit Plan modalities for pain control, muscle spasms, manual soft tissue mobilization, therapeutic exercise for ROM, gait   PT Home Exercise Plan ROM, quad sets every hour, foot intrinsic exercises, pain control, hip/knee flexion with assistance, weight shifting side to side in standing      Patient will benefit from skilled therapeutic intervention in order to improve the following deficits and impairments:  Decreased strength, Decreased balance, Hypomobility, Impaired flexibility, Pain, Impaired perceived functional ability, Decreased activity tolerance, Decreased endurance, Increased muscle spasms, Difficulty walking, Decreased range of motion  Visit Diagnosis: Pain in right leg  Difficulty in walking, not elsewhere classified  Other muscle spasm     Problem List There are no active problems to display for this patient.   Beacher MayBrooks, Logan Vegh PT 11/03/2016, 7:51 PM  [place] [facility] [address] [place], [place], [address] Phone: [phone]   Fax:  [phone]  Name: Peter BrunsMatthew T Parma MRN: [id number] Date of Birth: 01/14/1981

## 2016-11-04 ENCOUNTER — Ambulatory Visit: Payer: Self-pay | Admitting: Physical Therapy

## 2016-11-05 ENCOUNTER — Ambulatory Visit: Payer: Self-pay | Admitting: Physical Therapy

## 2016-11-10 ENCOUNTER — Encounter: Payer: Self-pay | Admitting: Physical Therapy

## 2016-11-10 ENCOUNTER — Ambulatory Visit: Payer: Self-pay | Admitting: Physical Therapy

## 2016-11-10 DIAGNOSIS — M79604 Pain in right leg: Secondary | ICD-10-CM

## 2016-11-10 DIAGNOSIS — M25661 Stiffness of right knee, not elsewhere classified: Secondary | ICD-10-CM

## 2016-11-10 DIAGNOSIS — M62838 Other muscle spasm: Secondary | ICD-10-CM

## 2016-11-10 DIAGNOSIS — R262 Difficulty in walking, not elsewhere classified: Secondary | ICD-10-CM

## 2016-11-10 DIAGNOSIS — M6281 Muscle weakness (generalized): Secondary | ICD-10-CM

## 2016-11-10 NOTE — Therapy (Signed)
[place] [facility] [address] [place], [place], [address] Phone: [phone]   Fax:  [phone]  Physical Therapy Treatment  Patient Details  Name: Peter BrunsMatthew T Baldini MRN: [id number] Date of Birth: 05-Jan-1981 Referring Provider: Abram Sanderpatterson, jennifer MD  Encounter Date: 11/10/2016      PT End of Session - 11/10/16 1825    Visit Number 25   Number of Visits 36   Date for PT Re-Evaluation 11/13/16   PT Start Time 1735   PT Stop Time 1835   PT Time Calculation (min) 60 min   Activity Tolerance Patient limited by pain;Patient limited by fatigue   Behavior During Therapy [facility] for tasks assessed/performed      Past Medical History:  Diagnosis Date  . Allergy   . Anxiety     History reviewed. No pertinent surgical history.  There were no vitals filed for this visit.      Subjective Assessment - 11/10/16 1745    Subjective Patient reports he is still working on bending his knee and is tender in right knee behind the knee. He is trying to place more pressure on his right leg for walking which increases his pain.    Limitations Sitting;Standing;Walking;House hold activities;Other (comment)   Patient Stated Goals to be able to perform personal care activites and return to normal activites without limitaitons (prior level of function)   Currently in Pain? Yes   Pain Score 8    Pain Location Knee   Pain Orientation Right   Pain Descriptors / Indicators Aching   Pain Type Acute pain;Surgical pain  05/30/2016   Pain Onset More than a month ago   Pain Frequency Intermittent      Objective: Gait: ambulating with 2 axillary crutches PWBRLE, decreased hip/knee flexion extension  Palpation: Decreased soft tissue mobility along right thigh, quadriceps muscle and superior to patella  Treatment:  NuStep x 5 min. Warm up prior to treatment for ROM Manual therapy: STM right quadriceps, anterior thigh, superior to  patellagoal: to improve mobility/decrease pain with patient supine partially reclined or sitting Tibia/femoral joint mobilization PA glides to increase knee flexion grade 1-2/23multiple sets 10 seconds Patella mobilization/distraction with glides superior/inferior and medial/lateral x 3 sets Therapeutic exercises: Patient performed with assistance, VC of therapist:  Assisted knee flexion/extension with patient in sitting in conjunction with STM and with multiple sets/repetition Isometric hamstring sets multi angle with mild to moderate resistance  Modalities:  Electrical stimulation; Russian stim. 10/10 cycle right VMO/quadriceps, high volt estim. Clinical protocol for muscle spasms with electrodes applied to lateral thigh over spasms, intensity to tolerance; x 20min.With patient in reclined position with pillow under right knee/LE;  Goal: muscle re education, pain control, reduce spasms, improve ROM  Patient response to treatment: Patient improved right knee flexion to 60 degrees following STM and with repetition. Patient demonstrated improved muscle control, joint mobility following manual therapy and estim. Reported decreased pain to 3/10 and improved flexibility in right knee following treatment.           PT Education - 11/10/16 1824    Education provided Yes   Education Details HEP: continue working on ROM and weight shifting activities to prepare for walking   Methods Explanation;Demonstration;Verbal cues   Comprehension Verbalized understanding;Returned demonstration;Verbal cues required             PT Long Term Goals - 10/26/16 1832      PT LONG TERM GOAL #1   Title Patient will  improve AAROM right knee up to 90 degrees by 11/13/2016 to allow him to sit and walk with more normal gait pattern and to allow decreased pain in knee   Baseline AAROM right knee 0-10 degrees flexion with pain; 10/26/16 0-50 degreed flexion   Status Revised     PT LONG TERM GOAL #2    Title patient will improve LEFS to 40/80 by 11/13/16 to demonstrate improving function with daily activities    Baseline LEFS 0/80; 10/26/16 deferred   Status Revised     PT LONG TERM GOAL #3   Title patient will have pain level max of 6/10 by 11/13/2016 to allow improved function with moving, sitting and walking   Baseline pain level max 10/10 with aggravating activities; 10/26/16 no pain reported unless moving right leg/knee then increases to 8-10/10   Status Revised     PT LONG TERM GOAL #4   Title imrpoved LEFS to 50/80 or better by 11/13/2016 indicating improved function with daily tasks/walking/personal care   Baseline LEFS 0/80   Status On-going     PT LONG TERM GOAL #5   Title patient will be able to walk with least restrictive device safely on level surfaces and stairs by 11/13/2016 to improve function with household and community activities   Baseline walking with rolling walker, WBAT RLE (non weight bearing due to pain)   Status On-going     PT LONG TERM GOAL #6   Title patient will be independent with home program for pain control, flexibiltiy, strength for right LE by discharge 11/13/16 in order to continue to improve function with self management   Baseline requires assistance and guidance for all exercises, limited knowledge of appropriate pain control strategies, progression of exercises    Status On-going               Plan - 11/10/16 1826    Clinical Impression Statement Patient continues to progress slowly due to pain level and decreased strength. He is working on ROM and has achieved 60 degrees of motion with STM and repetitive exercises/ROM.    Rehab Potential Good   PT Frequency 3x / week   PT Duration 12 weeks   PT Treatment/Interventions Electrical Stimulation;Gait training;Moist Heat;Therapeutic activities;Therapeutic exercise;Balance training;Neuromuscular re-education;Patient/family education;Passive range of motion;Scar mobilization;Manual techniques    PT Next Visit Plan modalities for pain control, muscle spasms, manual soft tissue mobilization, therapeutic exercise for ROM, gait   PT Home Exercise Plan ROM, quad sets every hour, foot intrinsic exercises, pain control, hip/knee flexion with assistance, weight shifting side to side in standing      Patient will benefit from skilled therapeutic intervention in order to improve the following deficits and impairments:  Decreased strength, Decreased balance, Hypomobility, Impaired flexibility, Pain, Impaired perceived functional ability, Decreased activity tolerance, Decreased endurance, Increased muscle spasms, Difficulty walking, Decreased range of motion  Visit Diagnosis: Pain in right leg  Difficulty in walking, not elsewhere classified  Other muscle spasm  Muscle weakness (generalized)  Stiffness of right knee, not elsewhere classified     Problem List There are no active problems to display for this patient.   Beacher MayBrooks, Brodi Kari PT 11/11/2016, 4:02 PM  [facility] [address] [place], [place], [address] Phone: [phone]   Fax:  [phone]  Name: Peter BrunsMatthew T Case MRN: [id number] Date of Birth: 1981/04/28

## 2016-11-11 ENCOUNTER — Ambulatory Visit: Payer: Self-pay | Admitting: Physical Therapy

## 2016-11-12 ENCOUNTER — Ambulatory Visit: Payer: Self-pay | Admitting: Physical Therapy

## 2016-11-18 ENCOUNTER — Ambulatory Visit: Payer: Self-pay | Attending: Orthopedic Surgery | Admitting: Physical Therapy

## 2016-11-18 ENCOUNTER — Encounter: Payer: Self-pay | Admitting: Physical Therapy

## 2016-11-18 DIAGNOSIS — M6281 Muscle weakness (generalized): Secondary | ICD-10-CM | POA: Insufficient documentation

## 2016-11-18 DIAGNOSIS — M25661 Stiffness of right knee, not elsewhere classified: Secondary | ICD-10-CM | POA: Insufficient documentation

## 2016-11-18 DIAGNOSIS — M62838 Other muscle spasm: Secondary | ICD-10-CM | POA: Insufficient documentation

## 2016-11-18 DIAGNOSIS — M79604 Pain in right leg: Secondary | ICD-10-CM | POA: Insufficient documentation

## 2016-11-18 DIAGNOSIS — R262 Difficulty in walking, not elsewhere classified: Secondary | ICD-10-CM | POA: Insufficient documentation

## 2016-11-18 NOTE — Therapy (Signed)
[facility] [address] [place], [place], [address] Phone: [phone]   Fax:  [phone]  Physical Therapy Treatment  Patient Details  Name: Peter Stanley MRN: [id number] Date of Birth: 03-05-1981 Referring Provider: Abram Sanderpatterson, jennifer MD  Encounter Date: 11/18/2016      PT End of Session - 11/18/16 1810    Visit Number 26   Number of Visits 49   Date for PT Re-Evaluation 02/10/17   PT Start Time 1725   PT Stop Time 1745   PT Time Calculation (min) 20 min   Activity Tolerance Patient limited by pain;Patient limited by fatigue   Behavior During Therapy [facility] for tasks assessed/performed      Past Medical History:  Diagnosis Date  . Allergy   . Anxiety     History reviewed. No pertinent surgical history.  There were no vitals filed for this visit.      Subjective Assessment - 11/18/16 1807    Subjective patient reports his right knee is loosening up, able to put more weight on the leg for walking. still a lot of pain with bending/weight bearing. his right knee is popping quite a bit without pain and he feels this is good. snowing outside, patient requests to leave appointment early due to using crutches and doesn't want to slip.    Limitations Sitting;Standing;Walking;House hold activities;Other (comment)   Patient Stated Goals to be able to perform personal care activites and return to normal activites without limitaitons (prior level of function)   Currently in Pain? Yes   Pain Score 3    Pain Location Knee   Pain Orientation Right   Pain Descriptors / Indicators Aching;Tightness   Pain Type Acute pain;Surgical pain  05/30/2016   Pain Onset More than a month ago      Objective: Gait: ambulating with 2 axillary crutches PWBRLE, decreased hip/knee flexion extension; attempted on axillary crutch for 2-3 steps with VC Palpation: Decreased soft tissue mobility along right thigh, quadriceps muscle  and superior to patella AAROM; right knee flexion 0-65 with effort, pain end range Outcome measure: LEFS 25/80   Treatment:  NuStep x 5 min. Warm up prior to treatment for ROM Manual therapy: STM right quadriceps, anterior thigh, superior to patellagoal: to improve mobility/decrease pain with patient supine partially reclined or sitting Tibia/femoral joint mobilization PA glides to increase knee flexion grade 1-2/243multiple sets 10 seconds Patella mobilization/distraction with glides superior/inferior and medial/lateral x 3 sets Therapeutic exercises: not performed due to inclement weather today:  Patient performed with assistance, VC of therapist:  Assisted knee flexion/extension with patient in sitting in conjunction with STM and with multiple sets/repetition Isometric hamstring sets multi angle with mild to moderate resistance  Modalities: Not performed today due to patient requesting to leave due to inclement weather: Electrical stimulation; Russian stim. 10/10 cycle right VMO/quadriceps, high volt estim. Clinical protocol for muscle spasms with electrodes applied to lateral thigh over spasms, intensity to tolerance; x 20min.With patient in reclined position with pillow under right knee/LE;  Goal: muscle re education, pain control, reduce spasms, improve ROM  Patient response to treatment: Limited session to manual  therapy and NuStep and no other exercise, modalities performed due to inclement weather (snow) and patient leaving prior to completion of planned session.        PT Education - 11/18/16 1750    Education provided Yes   Education Details HEP: continue working on quad control, strength and ROM   Person(s)  Educated Patient   Methods Explanation   Comprehension Verbalized understanding             PT Long Term Goals - 11/18/16 1748      PT LONG TERM GOAL #1   Title Patient will improve AAROM right knee up to 90 degrees by 12/14/2016 to allow him to sit  and walk with more normal gait pattern and to allow decreased pain in knee   Baseline AAROM right knee 0-10 degrees flexion with pain; 10/26/16 0-50 degreed flexion; 65 degrees flextion 11/19/16   Status Revised     PT LONG TERM GOAL #2   Title patient will improve LEFS to 40/80 by 01/13/17 to demonstrate improving function with daily activities    Baseline LEFS 0/80; 10/26/16 deferred; 11/19/16 25/80   Status Revised     PT LONG TERM GOAL #3   Title patient will have pain level max of 5/10 by 01/13/2017 to allow improved function with moving, sitting and walking   Baseline pain level max 10/10 with aggravating activities; 10/26/16 no pain reported unless moving right leg/knee then increases to 8-10/10   Status Revised     PT LONG TERM GOAL #4   Title imrpoved LEFS to 50/80 or better by 02/10/2017 indicating improved function with daily tasks/walking/personal care   Baseline LEFS 25/80   Status Revised     PT LONG TERM GOAL #5   Title patient will be able to walk with least restrictive device safely on level surfaces and stairs by 01/13/2017 to improve function with household and community activities   Baseline walking with rolling walker, WBAT RLE (non weight bearing due to pain); ambulating with axillary crutches, weaning to one crutch 11/19/16   Status Revised     PT LONG TERM GOAL #6   Title patient will be independent with home program for pain control, flexibiltiy, strength for right LE by discharge 02/10/17 in order to continue to improve function with self management   Baseline requires assistance and guidance for all exercises, limited knowledge of appropriate pain control strategies, progression of exercises    Status Revised               Plan - 11/18/16 1811    Clinical Impression Statement limited session per patient request due to sudden change in weather with snow sticking to ground outside and patient using crutches and anxious and afraid of falling. Patient is slowly,  steadily improving ROM, strength and ability to perform daily tasks such as dressing and walking with less difficulty. Pain, decreased strength, decreased endurance remain as limitations and will require additional physical therapy intervention to achieve goals.    Rehab Potential Good   Clinical Impairments Affecting Rehab Potential (+)age, acute condition (surgery), family support (-) severety of restrictions, increased pain level   PT Frequency 2x / week   PT Duration 12 weeks   PT Treatment/Interventions Electrical Stimulation;Gait training;Moist Heat;Therapeutic activities;Therapeutic exercise;Balance training;Neuromuscular re-education;Patient/family education;Passive range of motion;Scar mobilization;Manual techniques   PT Next Visit Plan modalities for pain control, muscle spasms, manual soft tissue mobilization, therapeutic exercise for ROM, gait   PT Home Exercise Plan ROM, quad sets every hour, foot intrinsic exercises, pain control, hip/knee flexion with assistance, weight shifting side to side in standing   Consulted and Agree with Plan of Care Patient      Patient will benefit from skilled therapeutic intervention in order to improve the following deficits and impairments:  Decreased strength, Decreased balance, Hypomobility, Impaired flexibility, Pain, Impaired  perceived functional ability, Decreased activity tolerance, Decreased endurance, Increased muscle spasms, Difficulty walking, Decreased range of motion  Visit Diagnosis: Pain in right leg - Plan: PT plan of care cert/re-cert  Difficulty in walking, not elsewhere classified - Plan: PT plan of care cert/re-cert  Other muscle spasm - Plan: PT plan of care cert/re-cert  Muscle weakness (generalized) - Plan: PT plan of care cert/re-cert  Stiffness of right knee, not elsewhere classified - Plan: PT plan of care cert/re-cert     Problem List There are no active problems to display for this patient.   Beacher May  PT 11/19/2016, 12:17 PM   [facility] [address], [place], [address] Phone: [phone]   Fax:  [phone]  Name: Peter Stanley MRN: [id number] Date of Birth: [birth year]

## 2016-11-19 ENCOUNTER — Ambulatory Visit: Payer: Self-pay | Admitting: Physical Therapy

## 2016-11-23 ENCOUNTER — Ambulatory Visit: Payer: Self-pay | Admitting: Physical Therapy

## 2016-11-24 ENCOUNTER — Ambulatory Visit: Payer: Self-pay | Admitting: Physical Therapy

## 2016-11-26 ENCOUNTER — Ambulatory Visit: Payer: Self-pay | Admitting: Physical Therapy

## 2016-11-30 ENCOUNTER — Ambulatory Visit: Payer: Self-pay | Admitting: Physical Therapy

## 2016-11-30 ENCOUNTER — Encounter: Payer: Self-pay | Admitting: Physical Therapy

## 2016-11-30 DIAGNOSIS — R262 Difficulty in walking, not elsewhere classified: Secondary | ICD-10-CM

## 2016-11-30 DIAGNOSIS — M25661 Stiffness of right knee, not elsewhere classified: Secondary | ICD-10-CM

## 2016-11-30 DIAGNOSIS — M62838 Other muscle spasm: Secondary | ICD-10-CM

## 2016-11-30 DIAGNOSIS — M79604 Pain in right leg: Secondary | ICD-10-CM

## 2016-11-30 DIAGNOSIS — M6281 Muscle weakness (generalized): Secondary | ICD-10-CM

## 2016-11-30 NOTE — Therapy (Signed)
[facility] [address] [place], [address] Phone: [phone]   Fax:  [phone]  Physical Therapy Treatment  Patient Details  Name: Peter Stanley MRN: [id number] Date of Birth: 1981-11-13 Referring Provider: Abram Sander MD  Encounter Date: 11/30/2016      PT End of Session - 11/30/16 1910    Visit Number 27   Number of Visits 49   Date for PT Re-Evaluation 02/10/17   PT Start Time 1751   PT Stop Time 1855   PT Time Calculation (min) 64 min   Activity Tolerance Patient limited by pain;Patient tolerated treatment well   Behavior During Therapy [facility] for tasks assessed/performed      Past Medical History:  Diagnosis Date  . Allergy   . Anxiety     History reviewed. No pertinent surgical history.  There were no vitals filed for this visit.      Subjective Assessment - 11/30/16 1756    Subjective beginning to walk without assistive device for a few steps. He reports his right hip is hurting more today due to trying to get into shower leading with right LE and he felt increased pain with the pressure/weight bearing.    Limitations Sitting;Standing;Walking;House hold activities;Other (comment)   Patient Stated Goals to be able to perform personal care activites and return to normal activites without limitaitons (prior level of function)   Currently in Pain? Yes   Pain Score 7    Pain Location Hip   Pain Orientation Right   Pain Descriptors / Indicators Aching   Pain Type Acute pain;Surgical pain  05/30/16   Pain Onset More than a month ago   Pain Frequency Intermittent      Objective: Gait: ambulating with 2 axillary crutches PWBRLE, decreased hip/knee flexion extension; ambulated with crutches for balance for ~10 steps Palpation: Decreased soft tissue mobility along lateral aspect right thigh, quadriceps muscle and inferior to patella AAROM; right knee flexion 0-60 with effort,  pain end range    Treatment:  NuStep x 5 min. Warm up prior to treatment for ROM (unbilled)  through partial ROM due to right anterior knee pain  Manual therapy: STM right quadriceps, anterior thigh, superior/inferior to patella 10 min.: goal: to improve mobility/decrease pain with patient supine partially reclined or sitting Patella mobilization/distraction with glides superior/inferior and medial/lateral x 3 sets  Therapeutic exercises: Patient performed with assistance, VC of therapist:  Assisted knee flexion/extension with patient in sitting/reclined position in conjunction with STM and with multiple sets/repetition Isometric hamstring sets multi angle with mild to moderate resistance Ball under knee patient performed AAROM knee extension x 5 alternating with knee flexion over ball x 5 reps   Modalities: Electrical stimulation; Russian stim. 10/10 cycle right VMO/quadriceps, high volt estim. Clinical protocol for muscle spasms with electrodes applied to lateral thigh over spasms, intensity to tolerance; x .With patient in reclined position with pillow under right knee/LE; Goal: muscle re education, pain control, reduce spasms, improve ROM  Patient response to treatment: Patient demonstrated improved technique with exercises with minimal VC for correct alignment, pain limited repetitions and required assistance to perform knee extension and flexion due to pain. Patient with decreased spasms by 30% following STM. Improved motor control with repetition and cuing and decreased spasms and improved motor control with estim.         PT Education - 11/30/16 1900    Education provided Yes   Education Details HEP: use of ball  for knee flexion in supine, sitting    Person(s) Educated Patient   Methods Explanation;Demonstration   Comprehension Verbalized understanding;Returned demonstration;Verbal cues required             PT Long Term Goals - 11/18/16 1748      PT  LONG TERM GOAL #1   Title Patient will improve AAROM right knee up to 90 degrees by 12/14/2016 to allow him to sit and walk with more normal gait pattern and to allow decreased pain in knee   Baseline AAROM right knee 0-10 degrees flexion with pain; 10/26/16 0-50 degreed flexion; 65 degrees flextion 11/19/16   Status Revised     PT LONG TERM GOAL #2   Title patient will improve LEFS to 40/80 by 01/13/17 to demonstrate improving function with daily activities    Baseline LEFS 0/80; 10/26/16 deferred; 11/19/16 25/80   Status Revised     PT LONG TERM GOAL #3   Title patient will have pain level max of 5/10 by 01/13/2017 to allow improved function with moving, sitting and walking   Baseline pain level max 10/10 with aggravating activities; 10/26/16 no pain reported unless moving right leg/knee then increases to 8-10/10   Status Revised     PT LONG TERM GOAL #4   Title imrpoved LEFS to 50/80 or better by 02/10/2017 indicating improved function with daily tasks/walking/personal care   Baseline LEFS 25/80   Status Revised     PT LONG TERM GOAL #5   Title patient will be able to walk with least restrictive device safely on level surfaces and stairs by 01/13/2017 to improve function with household and community activities   Baseline walking with rolling walker, WBAT RLE (non weight bearing due to pain); ambulating with axillary crutches, weaning to one crutch 11/19/16   Status Revised     PT LONG TERM GOAL #6   Title patient will be independent with home program for pain control, flexibiltiy, strength for right LE by discharge 02/10/17 in order to continue to improve function with self management   Baseline requires assistance and guidance for all exercises, limited knowledge of appropriate pain control strategies, progression of exercises    Status Revised               Plan - 11/30/16 1919    Clinical Impression Statement Patient is progressing with improved ability to walk with less assistance  of crutches. He continues with limitations of increased pain, weakness and decreased ROM that limit function with walking, performing daily activities.    Rehab Potential Good   PT Frequency 2x / week   PT Duration 12 weeks   PT Treatment/Interventions Electrical Stimulation;Gait training;Moist Heat;Therapeutic activities;Therapeutic exercise;Balance training;Neuromuscular re-education;Patient/family education;Passive range of motion;Scar mobilization;Manual techniques   PT Next Visit Plan modalities for pain control, muscle spasms, manual soft tissue mobilization, therapeutic exercise for ROM, gait   PT Home Exercise Plan continue with SLR, knee flexion, extension, weight shifting       Patient will benefit from skilled therapeutic intervention in order to improve the following deficits and impairments:  Decreased strength, Decreased balance, Hypomobility, Impaired flexibility, Pain, Impaired perceived functional ability, Decreased activity tolerance, Decreased endurance, Increased muscle spasms, Difficulty walking, Decreased range of motion  Visit Diagnosis: Pain in right leg  Muscle weakness (generalized)  Difficulty in walking, not elsewhere classified  Stiffness of right knee, not elsewhere classified  Other muscle spasm     Problem List There are no active problems to display for this patient.  Beacher MayBrooks, Shelita Steptoe PT 11/30/2016, 9:13 PM   [facility] [address] [place], [place], [address] Phone: [phone]   Fax:  [phone]  Name: Peter Stanley MRN: [id number] Date of Birth: 05-21-1981

## 2016-12-02 ENCOUNTER — Ambulatory Visit: Payer: Self-pay | Admitting: Physical Therapy

## 2016-12-04 ENCOUNTER — Ambulatory Visit: Payer: Self-pay | Admitting: Physical Therapy

## 2016-12-07 ENCOUNTER — Ambulatory Visit: Payer: Self-pay | Admitting: Physical Therapy

## 2016-12-07 ENCOUNTER — Encounter: Payer: Self-pay | Admitting: Physical Therapy

## 2016-12-07 DIAGNOSIS — M25661 Stiffness of right knee, not elsewhere classified: Secondary | ICD-10-CM

## 2016-12-07 DIAGNOSIS — M79604 Pain in right leg: Secondary | ICD-10-CM

## 2016-12-07 DIAGNOSIS — R262 Difficulty in walking, not elsewhere classified: Secondary | ICD-10-CM

## 2016-12-07 DIAGNOSIS — M6281 Muscle weakness (generalized): Secondary | ICD-10-CM

## 2016-12-08 NOTE — Therapy (Signed)
[facility] [address], [place], [address] Phone: [phone]   Fax:  [phone]  Physical Therapy Treatment  Patient Details  Name: Peter Stanley MRN: [id number] Date of Birth: 11-Aug-1981 Referring Provider: Abram Sander MD  Encounter Date: 12/07/2016      PT End of Session - 12/07/16 1840    Visit Number 28   Number of Visits 49   Date for PT Re-Evaluation 02/10/17   PT Start Time 1745   PT Stop Time 1838   PT Time Calculation (min) 53 min   Activity Tolerance Patient limited by pain;Patient tolerated treatment well   Behavior During Therapy [facility] for tasks assessed/performed      Past Medical History:  Diagnosis Date  . Allergy   . Anxiety     History reviewed. No pertinent surgical history.  There were no vitals filed for this visit.      Subjective Assessment - 12/07/16 1838    Subjective Patient reports he is feeling his right knee improving flexibility and is still weak in his leg and having difficulty with walking wihtout AD although he is decreasing use of crutches as able. He is sore in his right knee today because he has been working on bending knee and left it bent too long hangin over a ball ther other day.     Limitations Sitting;Standing;Walking;House hold activities;Other (comment)   Patient Stated Goals to be able to perform personal care activites and return to normal activites without limitaitons (prior level of function)   Currently in Pain? Yes   Pain Score 6    Pain Location Knee   Pain Orientation Right   Pain Descriptors / Indicators Aching;Tightness;Sore   Pain Type Acute pain;Surgical pain  05/30/2016   Pain Frequency Intermittent      Objective: Gait: ambulating with 2 axillary crutches PWBRLE, decreased hip/knee flexion extension; ambulated with crutches for balance for ~10 steps Palpation: Decreased soft tissue mobility along lateral aspect right  thigh, quadriceps muscle and inferior to patella AAROM; right knee flexion 0-50 with effort, pain end range pre treatment, increased to 60 post treatment    Treatment:  NuStep x 10 min. Warm up prior to treatment for ROM (unbilled)   Manual therapy: STM right quadriceps, anterior thigh, superior/inferior to patella 8 min.: goal: to improve mobility/decrease pain with patient supine partially reclined or sitting Patella mobilization/distraction with glides superior/inferior and medial/lateral x 3 sets  Therapeutic exercises: Patient performed with assistance, VC of therapist:  Assisted knee flexion/extension with patient in sitting position in conjunction with STM and with multiple sets/repetition Isometric hamstring sets multi angle with mild to moderate resistance   Modalities: Electrical stimulation; Russian stim. 10/10 cycle right VMO/quadriceps, high volt estim. Clinical protocol for muscle spasms with electrodes applied to medial and lateral aspect of right knee intensity to tolerance; x .With patient in reclined position with pillow under right knee/LE; Goal: muscle re education, pain control, reduce spasms, improve ROM  Patient response to treatment: Patient improved AAROM right knee to 60 degrees with repetition, STM with increased flexibility noted and less pain than previous sessions. Patient able to walk with use of crutch for balance with increased weight bearing through right LE.   Improved motor control with repetition and cuing and decreased spasms and improved motor control with estim.          PT Education - 12/07/16 1840    Education provided Yes   Education Details HEP:  reassessed home exercises for ROM, strengthening, walking   Person(s) Educated Patient   Methods Explanation   Comprehension Verbalized understanding             PT Long Term Goals - 11/18/16 1748      PT LONG TERM GOAL #1   Title Patient will improve AAROM right knee up  to 90 degrees by 12/14/2016 to allow him to sit and walk with more normal gait pattern and to allow decreased pain in knee   Baseline AAROM right knee 0-10 degrees flexion with pain; 10/26/16 0-50 degreed flexion; 65 degrees flextion 11/19/16   Status Revised     PT LONG TERM GOAL #2   Title patient will improve LEFS to 40/80 by 01/13/17 to demonstrate improving function with daily activities    Baseline LEFS 0/80; 10/26/16 deferred; 11/19/16 25/80   Status Revised     PT LONG TERM GOAL #3   Title patient will have pain level max of 5/10 by 01/13/2017 to allow improved function with moving, sitting and walking   Baseline pain level max 10/10 with aggravating activities; 10/26/16 no pain reported unless moving right leg/knee then increases to 8-10/10   Status Revised     PT LONG TERM GOAL #4   Title imrpoved LEFS to 50/80 or better by 02/10/2017 indicating improved function with daily tasks/walking/personal care   Baseline LEFS 25/80   Status Revised     PT LONG TERM GOAL #5   Title patient will be able to walk with least restrictive device safely on level surfaces and stairs by 01/13/2017 to improve function with household and community activities   Baseline walking with rolling walker, WBAT RLE (non weight bearing due to pain); ambulating with axillary crutches, weaning to one crutch 11/19/16   Status Revised     PT LONG TERM GOAL #6   Title patient will be independent with home program for pain control, flexibiltiy, strength for right LE by discharge 02/10/17 in order to continue to improve function with self management   Baseline requires assistance and guidance for all exercises, limited knowledge of appropriate pain control strategies, progression of exercises    Status Revised               Plan - 12/07/16 1843    Clinical Impression Statement Patient continues to progress steadily, slowly due to continued pain in right knee and inability to attend pysical therapy consistently due to  inclement weather over the past few weeks. He should continue to improve with additional physical therapy intervention to improve functional limitation of strength, ROM and endurance.    Rehab Potential Good   PT Frequency 2x / week   PT Duration 12 weeks   PT Treatment/Interventions Electrical Stimulation;Gait training;Moist Heat;Therapeutic activities;Therapeutic exercise;Balance training;Neuromuscular re-education;Patient/family education;Passive range of motion;Scar mobilization;Manual techniques   PT Next Visit Plan modalities for pain control, muscle spasms, manual soft tissue mobilization, therapeutic exercise for ROM, gait   PT Home Exercise Plan continue with SLR, knee flexion, extension, weight shifting       Patient will benefit from skilled therapeutic intervention in order to improve the following deficits and impairments:  Decreased strength, Decreased balance, Hypomobility, Impaired flexibility, Pain, Impaired perceived functional ability, Decreased activity tolerance, Decreased endurance, Increased muscle spasms, Difficulty walking, Decreased range of motion  Visit Diagnosis: Stiffness of right knee, not elsewhere classified  Muscle weakness (generalized)  Pain in right leg  Difficulty in walking, not elsewhere classified     Problem List There  are no active problems to display for this patient.   Beacher MayBrooks, Keegan Bensch PT 12/08/2016, 10:32 PM  [facility] [address] [place], [place], [address] Phone: [phone]   Fax:  [phone]  Name: Peter Stanley MRN: [id number] Date of Birth: [birth year]

## 2016-12-09 ENCOUNTER — Ambulatory Visit: Payer: Self-pay | Admitting: Physical Therapy

## 2016-12-11 ENCOUNTER — Ambulatory Visit: Payer: Self-pay | Admitting: Physical Therapy

## 2016-12-14 ENCOUNTER — Encounter: Payer: Self-pay | Admitting: Physical Therapy

## 2016-12-14 ENCOUNTER — Ambulatory Visit: Payer: Self-pay | Admitting: Physical Therapy

## 2016-12-14 DIAGNOSIS — R262 Difficulty in walking, not elsewhere classified: Secondary | ICD-10-CM

## 2016-12-14 DIAGNOSIS — M25661 Stiffness of right knee, not elsewhere classified: Secondary | ICD-10-CM

## 2016-12-14 DIAGNOSIS — M6281 Muscle weakness (generalized): Secondary | ICD-10-CM

## 2016-12-14 DIAGNOSIS — M79604 Pain in right leg: Secondary | ICD-10-CM

## 2016-12-14 NOTE — Therapy (Signed)
[place] [facility] [address] [place], [place], [address] Phone: [phone]   Fax:  [phone]  Physical Therapy Treatment  Patient Details  Name: Peter Stanley MRN: [id number] Date of Birth: 1980/12/14 Referring Provider: Abram Sanderpatterson, jennifer MD  Encounter Date: 12/14/2016      PT End of Session - 12/14/16 1821    Visit Number 29   Number of Visits 49   Date for PT Re-Evaluation 02/10/17   PT Start Time 1745   PT Stop Time 1830   PT Time Calculation (min) 45 min   Activity Tolerance Patient limited by pain;Patient tolerated treatment well   Behavior During Therapy [facility] for tasks assessed/performed      Past Medical History:  Diagnosis Date  . Allergy   . Anxiety     History reviewed. No pertinent surgical history.  There were no vitals filed for this visit.      Subjective Assessment - 12/14/16 1816    Subjective Patient reports he is still working on improving ROM right knee and feels he has overdone it with stretching. He was unable to be seen by MD last week due finances and not qualifying for assistance. He is still having pain in thigh and feels like he is having a screw coming out into his muscle. He is going for an X ray to be sure everything is ok.    Limitations Sitting;Standing;Walking;House hold activities;Other (comment)   Patient Stated Goals to be able to perform personal care activites and return to normal activites without limitaitons (prior level of function)   Currently in Pain? Yes   Pain Score 8    Pain Location Knee   Pain Orientation Right   Pain Descriptors / Indicators Aching;Burning;Tightness;Sore   Pain Type Acute pain;Surgical pain  05/30/2016   Pain Onset More than a month ago   Pain Frequency Intermittent      Objective: Gait: ambulating with 2 axillary crutches PWBRLE, decreased hip/knee flexion and extension Palpation: Decreased soft tissue mobility along lateral  aspectright thigh, quadriceps muscle superior and inferior to patella AAROM: right knee unable <40 degrees on arrival with pain limiting motion  Treatment:  NuStep with assist 2 min. Warm up prior to treatment for ROM (unable to perform due to continued posterior knee pain) Manual therapy: STM right quadriceps, anterior thigh, superior/inferiorto patella 15 min.: goal: to improve mobility/decrease pain with patient supine partially reclined or sitting Patella mobilization/distraction with glides superior/inferior and medial/lateral x 3 sets  Therapeutic exercises: Patient performed with assistance, VC of therapist:  Assisted knee flexion/extension with patient in long sitting positionin conjunction with STM and with multiple sets/repetition Isometric hamstring sets multi angle with mild to moderate resistance  Modalities: Electrical stimulation; Russian stim. 10/10 cycle right VMO/quadriceps,  intensity to tolerance; x 20min.With patient in reclined position with pillow under right knee/LE; Goal: muscle re education, pain control, reduce spasms, improve ROM  Patient response to treatment: Patient with reported decreased pain by at least 50% following STM, patella mobilization and estim. Patient with decreased knee flexion to 50 degrees due to pain. Improved soft tissue and ability to perform exercises following STM to quadriceps. Pain limits exercises today       PT Education - 12/14/16 1820    Education provided Yes   Education Details HEP; continue with intermittent ROM and heat/ice to assist with pain control   Person(s) Educated Patient   Methods Explanation   Comprehension Verbalized understanding  PT Long Term Goals - 11/18/16 1748      PT LONG TERM GOAL #1   Title Patient will improve AAROM right knee up to 90 degrees by 12/14/2016 to allow him to sit and walk with more normal gait pattern and to allow decreased pain in knee   Baseline AAROM  right knee 0-10 degrees flexion with pain; 10/26/16 0-50 degreed flexion; 65 degrees flextion 11/19/16   Status Revised     PT LONG TERM GOAL #2   Title patient will improve LEFS to 40/80 by 01/13/17 to demonstrate improving function with daily activities    Baseline LEFS 0/80; 10/26/16 deferred; 11/19/16 25/80   Status Revised     PT LONG TERM GOAL #3   Title patient will have pain level max of 5/10 by 01/13/2017 to allow improved function with moving, sitting and walking   Baseline pain level max 10/10 with aggravating activities; 10/26/16 no pain reported unless moving right leg/knee then increases to 8-10/10   Status Revised     PT LONG TERM GOAL #4   Title imrpoved LEFS to 50/80 or better by 02/10/2017 indicating improved function with daily tasks/walking/personal care   Baseline LEFS 25/80   Status Revised     PT LONG TERM GOAL #5   Title patient will be able to walk with least restrictive device safely on level surfaces and stairs by 01/13/2017 to improve function with household and community activities   Baseline walking with rolling walker, WBAT RLE (non weight bearing due to pain); ambulating with axillary crutches, weaning to one crutch 11/19/16   Status Revised     PT LONG TERM GOAL #6   Title patient will be independent with home program for pain control, flexibiltiy, strength for right LE by discharge 02/10/17 in order to continue to improve function with self management   Baseline requires assistance and guidance for all exercises, limited knowledge of appropriate pain control strategies, progression of exercises    Status Revised               Plan - 12/14/16 1821    Clinical Impression Statement Patient continues with spasms and pain in right LE with decreased ROM and strength and will require additional physical therapy intervention to achieve goals.    Rehab Potential Good   PT Frequency 2x / week   PT Duration 12 weeks   PT Treatment/Interventions Electrical  Stimulation;Gait training;Moist Heat;Therapeutic activities;Therapeutic exercise;Balance training;Neuromuscular re-education;Patient/family education;Passive range of motion;Scar mobilization;Manual techniques   PT Next Visit Plan modalities for pain control, muscle spasms, manual soft tissue mobilization, therapeutic exercise for ROM, gait   PT Home Exercise Plan continue with SLR, knee flexion, extension, weight shifting       Patient will benefit from skilled therapeutic intervention in order to improve the following deficits and impairments:  Decreased strength, Decreased balance, Hypomobility, Impaired flexibility, Pain, Impaired perceived functional ability, Decreased activity tolerance, Decreased endurance, Increased muscle spasms, Difficulty walking, Decreased range of motion  Visit Diagnosis: Stiffness of right knee, not elsewhere classified  Muscle weakness (generalized)  Pain in right leg  Difficulty in walking, not elsewhere classified     Problem List There are no active problems to display for this patient.   Beacher May PT 12/15/2016, 11:19 AM  [facility] SPORTS [facility]IC[facility] [address] [place], [address] Phone: [phone]   Fax:  [phone]  Name: Peter Stanley MRN: [id number] Date of Birth: [birth year]

## 2016-12-16 ENCOUNTER — Ambulatory Visit: Payer: Self-pay | Admitting: Physical Therapy

## 2016-12-21 ENCOUNTER — Encounter: Payer: Self-pay | Admitting: Physical Therapy

## 2016-12-21 ENCOUNTER — Ambulatory Visit: Payer: Self-pay | Attending: Orthopedic Surgery | Admitting: Physical Therapy

## 2016-12-21 DIAGNOSIS — M79604 Pain in right leg: Secondary | ICD-10-CM | POA: Insufficient documentation

## 2016-12-21 DIAGNOSIS — R262 Difficulty in walking, not elsewhere classified: Secondary | ICD-10-CM | POA: Insufficient documentation

## 2016-12-21 DIAGNOSIS — M6281 Muscle weakness (generalized): Secondary | ICD-10-CM | POA: Insufficient documentation

## 2016-12-21 DIAGNOSIS — M62838 Other muscle spasm: Secondary | ICD-10-CM | POA: Insufficient documentation

## 2016-12-21 DIAGNOSIS — M25661 Stiffness of right knee, not elsewhere classified: Secondary | ICD-10-CM | POA: Insufficient documentation

## 2016-12-22 NOTE — Therapy (Signed)
[place] [facility] [address] [place], [place], [address] Phone: [phone]   Fax:  [phone]  Physical Therapy Treatment  Patient Details  Name: Peter BrunsMatthew T Basil MRN: [id number] Date of Birth: 07-13-1981 Referring Provider: Abram Sanderpatterson, jennifer MD  Encounter Date: 12/21/2016      PT End of Session - 12/21/16 1617    Visit Number 30   Number of Visits 49   Date for PT Re-Evaluation 02/10/17   PT Start Time 1608   PT Stop Time 1705   PT Time Calculation (min) 57 min   Activity Tolerance Patient limited by pain;Patient tolerated treatment well   Behavior During Therapy [facility] for tasks assessed/performed      Past Medical History:  Diagnosis Date  . Allergy   . Anxiety     History reviewed. No pertinent surgical history.  There were no vitals filed for this visit.      Subjective Assessment - 12/21/16 1612    Subjective Patient reports he is having pain in right thigh, tightness with sitting. Standing and walking is improving slowly due to weight bearing on right LE increases knee pain.    Limitations Sitting;Standing;Walking;House hold activities;Other (comment)   Patient Stated Goals to be able to perform personal care activites and return to normal activites without limitaitons (prior level of function)   Currently in Pain? Yes   Pain Score 7    Pain Location Leg  thigh   Pain Orientation Right   Pain Descriptors / Indicators Aching;Tightness   Pain Type Acute pain  surgery 05/30/2016   Pain Onset More than a month ago   Pain Frequency Constant      Objective: Gait: ambulating with 2 axillary crutches WBAT RLE with decreased weight shift to right, decreased hip/knee flexion Palpation: Decreased soft tissue mobility along lateral aspectright thigh, quadriceps muscle superior and inferior to patella AAROM: right knee flexion 5-45 degrees, with pain limiting flexion and extension  Treatment:   Manual therapy: STM right quadriceps, anterior thigh, superior/inferiorto patella 8 min.: goal: to improve mobility/decrease pain with patient supine partially reclined or sitting Patella mobilization/distraction with glides superior/inferior and medial/lateral x 3 sets  Therapeutic exercises: Patient performed with assistance, VC of therapist:  Assisted knee flexion/extension with patient in sitting positionin conjunction with STM and with multiple sets/repetition Ball roll outs in sitting for flexion/extension with assist of therapist Isometric hamstring sets multi angle with mild to moderate resistance  Modalities: Electrical stimulation; Russian stim. 10/10 cycle right VMO/quadriceps,  intensity to tolerance; x 20min.With patient in reclined position with pillow under right knee/LE; moist heat applied to right knee/quadriceps with estim; Goal: muscle re education, pain control, reduce spasms, improve ROM  Patient response to treatment: Patient demonstrated improved technique with exercises with minimal VC for correct alignment. Patient with decreased pain from  7 /10 to 5 /10. Patient with decreased spasms by 50% following STM. Improved motor control with repetition and cuing, following estim. Patient improved knee flexion to 60 degrees flexion with pain at end of session. No adverse skin reaction noted with modalities estim., moist heat.         PT Education - 12/21/16 1700    Education provided Yes   Education Details HEP: work on knee extension, flexion in sitting with ball and AROM as tolerated   Person(s) Educated Patient   Methods Explanation;Demonstration;Verbal cues   Comprehension Verbalized understanding;Returned demonstration;Verbal cues required  PT Long Term Goals - 11/18/16 1748      PT LONG TERM GOAL #1   Title Patient will improve AAROM right knee up to 90 degrees by 12/14/2016 to allow him to sit and walk with more normal gait pattern  and to allow decreased pain in knee   Baseline AAROM right knee 0-10 degrees flexion with pain; 10/26/16 0-50 degreed flexion; 65 degrees flextion 11/19/16   Status Revised     PT LONG TERM GOAL #2   Title patient will improve LEFS to 40/80 by 01/13/17 to demonstrate improving function with daily activities    Baseline LEFS 0/80; 10/26/16 deferred; 11/19/16 25/80   Status Revised     PT LONG TERM GOAL #3   Title patient will have pain level max of 5/10 by 01/13/2017 to allow improved function with moving, sitting and walking   Baseline pain level max 10/10 with aggravating activities; 10/26/16 no pain reported unless moving right leg/knee then increases to 8-10/10   Status Revised     PT LONG TERM GOAL #4   Title imrpoved LEFS to 50/80 or better by 02/10/2017 indicating improved function with daily tasks/walking/personal care   Baseline LEFS 25/80   Status Revised     PT LONG TERM GOAL #5   Title patient will be able to walk with least restrictive device safely on level surfaces and stairs by 01/13/2017 to improve function with household and community activities   Baseline walking with rolling walker, WBAT RLE (non weight bearing due to pain); ambulating with axillary crutches, weaning to one crutch 11/19/16   Status Revised     PT LONG TERM GOAL #6   Title patient will be independent with home program for pain control, flexibiltiy, strength for right LE by discharge 02/10/17 in order to continue to improve function with self management   Baseline requires assistance and guidance for all exercises, limited knowledge of appropriate pain control strategies, progression of exercises    Status Revised               Plan - 12/21/16 1710    Clinical Impression Statement Patient continues with pain in right knee which is the primary limiting factor for improving ROM into flexion. He is progressing slowly with ambulation due to pain and stiffness as he heals from injury. He will benefit from  continued physical therapy intervention in order to improve function.    Rehab Potential Good   PT Frequency 2x / week   PT Duration 12 weeks   PT Treatment/Interventions Electrical Stimulation;Gait training;Moist Heat;Therapeutic activities;Therapeutic exercise;Balance training;Neuromuscular re-education;Patient/family education;Passive range of motion;Scar mobilization;Manual techniques   PT Next Visit Plan modalities for pain control, muscle spasms, manual soft tissue mobilization, therapeutic exercise for ROM, gait   PT Home Exercise Plan continue with SLR, knee flexion, extension, weight shifting       Patient will benefit from skilled therapeutic intervention in order to improve the following deficits and impairments:  Decreased strength, Decreased balance, Hypomobility, Impaired flexibility, Pain, Impaired perceived functional ability, Decreased activity tolerance, Decreased endurance, Increased muscle spasms, Difficulty walking, Decreased range of motion  Visit Diagnosis: Stiffness of right knee, not elsewhere classified  Muscle weakness (generalized)  Pain in right leg  Difficulty in walking, not elsewhere classified  Other muscle spasm     Problem List There are no active problems to display for this patient.   Beacher May PT 12/22/2016, 12:22 PM  [place] [facility] [address] [place], [address] Phone: [phone]   Fax:  [phone]  Name: DENNIE MOLTZ MRN: [id number] Date of Birth: [birth year]

## 2016-12-24 ENCOUNTER — Encounter: Payer: Self-pay | Admitting: Physical Therapy

## 2016-12-24 ENCOUNTER — Ambulatory Visit: Payer: Self-pay | Admitting: Physical Therapy

## 2016-12-24 DIAGNOSIS — M25661 Stiffness of right knee, not elsewhere classified: Secondary | ICD-10-CM

## 2016-12-24 DIAGNOSIS — M6281 Muscle weakness (generalized): Secondary | ICD-10-CM

## 2016-12-24 DIAGNOSIS — M79604 Pain in right leg: Secondary | ICD-10-CM

## 2016-12-24 DIAGNOSIS — R262 Difficulty in walking, not elsewhere classified: Secondary | ICD-10-CM

## 2016-12-24 DIAGNOSIS — M62838 Other muscle spasm: Secondary | ICD-10-CM

## 2016-12-25 NOTE — Therapy (Signed)
[place] [facility] [address] [place], [place], [address] Phone: [phone]   Fax:  [phone]  Physical Therapy Treatment  Patient Details  Name: Peter BrunsMatthew T Garber MRN: [id number] Date of Birth: 06-02-1981 Referring Provider: Abram Sanderpatterson, jennifer MD  Encounter Date: 12/24/2016      PT End of Session - 12/24/16 1806    Visit Number 31   Number of Visits 49   Date for PT Re-Evaluation 02/10/17   PT Start Time 1735   PT Stop Time 1824   PT Time Calculation (min) 49 min   Activity Tolerance Patient limited by pain;Patient tolerated treatment well   Behavior During Therapy [facility] for tasks assessed/performed      Past Medical History:  Diagnosis Date  . Allergy   . Anxiety     History reviewed. No pertinent surgical history.  There were no vitals filed for this visit.      Subjective Assessment - 12/24/16 1751    Subjective continues with right thigh pain. no pain at rest. increases to 9/10 with attempted movement of leg.    Limitations Sitting;Standing;Walking;House hold activities;Other (comment)   Patient Stated Goals to be able to perform personal care activites and return to normal activites without limitaitons (prior level of function)   Currently in Pain? No/denies  increases pain to 9/10 with pressure, movement and weight bearing right LE      Objective: Gait: ambulating with 2 axillary crutches WBAT RLE with decreased weight shift to right, decreased hip/knee flexion Palpation: Decreased soft tissue mobility along lateral aspectright thigh, quadriceps muscle superior and inferior to patella AAROM: right knee flexion 50 degrees, with pain limiting motion  Treatment:  Manual therapy: STM right quadriceps, anterior thigh, superior/inferiorto patella 10min.: goal: to improve mobility/decrease pain with patient supine partially reclined or sitting Patella mobilization/distraction with glides  superior/inferior and medial/lateral x 3 sets  Therapeutic exercises: Patient performed with assistance, VC of therapist:  Assisted hip/ knee flexion/extension with patient in reclined supine positionin conjunction with STM and with multiple sets/repetition Isometric hamstring sets multi angle with mild to moderate resistance  Modalities: Electrical stimulation; Russian stim. 10/10 cycle right VMO/quadriceps, intensity to tolerance; high volt estim. Applied with (2) electrodes placed medial and inferior to patella over tender areas, intensity to tolerance x 20min.With patient in reclined position with pillow under right knee/LE;Goal: muscle re education, pain control, reduce spasms, improve ROM  Patient response to treatment: Patient able to perform hip/knee flexion in reclined supine position with less pain and difficulty with assistance of therapist. He reported decreased pain with flexion with repetition. Patient with decreased spasms by 50% following STM.  Patient with improved quad control following estim., improved knee flexion to ~60 degrees following hip/knee flexion exercise. No adverse skin reaction noted with modalities.       PT Education - 12/24/16 1820    Education provided Yes   Education Details HEP: increase knee fleixon with hip/knee flexion and isometric end range flexion with ball   Person(s) Educated Patient   Methods Explanation;Verbal cues;Other (comment)  with assistance of therapist to demonstrate   Comprehension Verbalized understanding;Returned demonstration;Verbal cues required             PT Long Term Goals - 11/18/16 1748      PT LONG TERM GOAL #1   Title Patient will improve AAROM right knee up to 90 degrees by 12/14/2016 to allow him to sit and walk with more normal gait pattern and to  allow decreased pain in knee   Baseline AAROM right knee 0-10 degrees flexion with pain; 10/26/16 0-50 degreed flexion; 65 degrees flextion 11/19/16   Status  Revised     PT LONG TERM GOAL #2   Title patient will improve LEFS to 40/80 by 01/13/17 to demonstrate improving function with daily activities    Baseline LEFS 0/80; 10/26/16 deferred; 11/19/16 25/80   Status Revised     PT LONG TERM GOAL #3   Title patient will have pain level max of 5/10 by 01/13/2017 to allow improved function with moving, sitting and walking   Baseline pain level max 10/10 with aggravating activities; 10/26/16 no pain reported unless moving right leg/knee then increases to 8-10/10   Status Revised     PT LONG TERM GOAL #4   Title imrpoved LEFS to 50/80 or better by 02/10/2017 indicating improved function with daily tasks/walking/personal care   Baseline LEFS 25/80   Status Revised     PT LONG TERM GOAL #5   Title patient will be able to walk with least restrictive device safely on level surfaces and stairs by 01/13/2017 to improve function with household and community activities   Baseline walking with rolling walker, WBAT RLE (non weight bearing due to pain); ambulating with axillary crutches, weaning to one crutch 11/19/16   Status Revised     PT LONG TERM GOAL #6   Title patient will be independent with home program for pain control, flexibiltiy, strength for right LE by discharge 02/10/17 in order to continue to improve function with self management   Baseline requires assistance and guidance for all exercises, limited knowledge of appropriate pain control strategies, progression of exercises    Status Revised               Plan - 12/24/16 1807    Clinical Impression Statement Patient with increased right thigh pain for the past 2 weeks. Demonstrates improving ROM slowly in  right knee due to pain and stiffness. He is responding well to treatment and should continue to improve with consistent attendance.    Rehab Potential Good   PT Frequency 2x / week   PT Duration 12 weeks   PT Treatment/Interventions Electrical Stimulation;Gait training;Moist  Heat;Therapeutic activities;Therapeutic exercise;Balance training;Neuromuscular re-education;Patient/family education;Passive range of motion;Scar mobilization;Manual techniques   PT Next Visit Plan modalities for pain control, muscle spasms, manual soft tissue mobilization, therapeutic exercise for ROM, gait   PT Home Exercise Plan continue with SLR, knee flexion, extension, weight shifting       Patient will benefit from skilled therapeutic intervention in order to improve the following deficits and impairments:  Decreased strength, Decreased balance, Hypomobility, Impaired flexibility, Pain, Impaired perceived functional ability, Decreased activity tolerance, Decreased endurance, Increased muscle spasms, Difficulty walking, Decreased range of motion  Visit Diagnosis: Stiffness of right knee, not elsewhere classified  Muscle weakness (generalized)  Pain in right leg  Difficulty in walking, not elsewhere classified  Other muscle spasm     Problem List There are no active problems to display for this patient.   Beacher May PT 12/25/2016, 4:09 PM  [place] [facility] [address], [place], [address] Phone: [phone]   Fax:  [phone]  Name: ZARIAH JOST MRN: [id number] Date of Birth: 08-04-1981

## 2016-12-28 ENCOUNTER — Ambulatory Visit: Admitting: Physical Therapy

## 2016-12-31 ENCOUNTER — Ambulatory Visit: Admitting: Physical Therapy

## 2016-12-31 ENCOUNTER — Encounter: Payer: Self-pay | Admitting: Physical Therapy

## 2016-12-31 DIAGNOSIS — M79604 Pain in right leg: Secondary | ICD-10-CM

## 2016-12-31 DIAGNOSIS — M25661 Stiffness of right knee, not elsewhere classified: Secondary | ICD-10-CM

## 2016-12-31 DIAGNOSIS — M62838 Other muscle spasm: Secondary | ICD-10-CM

## 2016-12-31 DIAGNOSIS — M6281 Muscle weakness (generalized): Secondary | ICD-10-CM

## 2016-12-31 DIAGNOSIS — R262 Difficulty in walking, not elsewhere classified: Secondary | ICD-10-CM

## 2016-12-31 NOTE — Therapy (Signed)
[place] [facility] [address], [place], [address] Phone: [phone]   Fax:  [phone]  Physical Therapy Treatment  Patient Details  Name: Peter Stanley MRN: [id number] Date of Birth: Feb 27, 1981 Referring Provider: Abram Sander MD  Encounter Date: 12/31/2016      PT End of Session - 12/31/16 1753    Visit Number 32   Number of Visits 49   Date for PT Re-Evaluation 02/10/17   PT Start Time 1750   PT Stop Time 1850   PT Time Calculation (min) 60 min   Activity Tolerance Patient limited by pain;Patient tolerated treatment well   Behavior During Therapy [facility] for tasks assessed/performed      Past Medical History:  Diagnosis Date  . Allergy   . Anxiety     History reviewed. No pertinent surgical history.  There were no vitals filed for this visit.      Subjective Assessment - 12/31/16 1750    Subjective Patient reports decreased pain in right knee has been less this week. He continues to have increased tightness in knee with prolonged sitting in car or chair.    Limitations Sitting;Standing;Walking;House hold activities;Other (comment)   How long can you stand comfortably? unable to stand for any length of time   How long can you walk comfortably? walks with walker only short distances   Patient Stated Goals to be able to perform personal care activites and return to normal activites without limitaitons (prior level of function)   Currently in Pain? No/denies      Objective: Gait: ambulating with 2 axillary crutches WBAT RLE with decreased weight shift to right, decreased hip/knee flexion Palpation: decreased soft tissue mobility with spasms superior to patella in quadriceps muscle and patella tendon AAROM: right knee flexion 45 degrees, with pain limiting motion pre treatment, up to 60 degrees with pain post treatment  Treatment:  Manual therapy: STM right quadriceps,  superior/inferiorto patella .: goal: to improve mobility/decrease pain with patient supine partially reclined or sitting Patella mobilization/distraction with glides superior/inferior and medial/lateral x 3 sets Modalities: Ultrasound 100% @ 1.0wcm2 x 10 min applied to patella tendon and superior to patella right LE: goal improve tissue mobility, pain, ROM  Therapeutic exercises: Patient performed with assistance, VC of therapist:  Assisted hip/ knee flexion/extension with patient in reclined supine positionin conjunction with STM and with multiple sets/repetition Isometric hamstring sets multi angle with mild to moderate resistance  Modalities: Electrical stimulation; Russian stim. 10/10 cycle right VMO/quadriceps, intensity to tolerance; high volt estim. Applied with (2) electrodes placed superior and inferior to patella over spasms, intensity to tolerance x .With patient in reclined position with pillow under right knee/LE;Goal: muscle re education, pain control, reduce spasms, improve ROM  Patient response to treatment: Patient with improved soft tissue elasticity following STM, [place] with improved ablity to tolerate exercises for ROM. Improved AAROM right knee to 60 degrees following treatment of STM, [place], there. Ex. Patient with decreased spasms by 50% following STM, [place]. Patient reported decreased pain, tenderness by 30% at end of session         PT Education - 12/31/16 1753    Education provided Yes   Education Details HEP: increase knee flexion ROM with hip/knee and isometric end range flexion.   Person(s) Educated Patient   Methods Explanation;Verbal cues   Comprehension Verbalized understanding;Returned demonstration;Verbal cues required             PT Long Term Goals -  12/31/16 1846      PT LONG TERM GOAL #1   Title Patient will improve AAROM right knee up to 90 degrees by 12/14/2016 to allow him to sit and walk with more normal gait pattern and  to allow decreased pain in knee   Baseline AAROM right knee 0-10 degrees flexion with pain; 10/26/16 0-50 degreed flexion; 65 degrees flextion 11/19/16; 12/31/16 60 degrees flexion   Status On-going     PT LONG TERM GOAL #2   Title patient will improve LEFS to 40/80 by 01/13/17 to demonstrate improving function with daily activities    Baseline LEFS 0/80; 10/26/16 deferred; 11/19/16 25/80   Status Deferred     PT LONG TERM GOAL #3   Title patient will have pain level max of 5/10 by 01/13/2017 to allow improved function with moving, sitting and walking   Baseline pain level max 10/10 with aggravating activities; 10/26/16 no pain reported unless moving right leg/knee then increases to 8-10/10   Status On-going     PT LONG TERM GOAL #4   Title imrpoved LEFS to 50/80 or better by 02/10/2017 indicating improved function with daily tasks/walking/personal care   Baseline LEFS 25/80   Status On-going     PT LONG TERM GOAL #5   Title patient will be able to walk with least restrictive device safely on level surfaces and stairs by 01/13/2017 to improve function with household and community activities   Baseline walking with rolling walker, WBAT RLE (non weight bearing due to pain); ambulating with axillary crutches, weaning to one crutch 11/19/16   Status On-going     PT LONG TERM GOAL #6   Title patient will be independent with home program for pain control, flexibiltiy, strength for right LE by discharge 02/10/17 in order to continue to improve function with self management   Baseline requires assistance and guidance for all exercises, limited knowledge of appropriate pain control strategies, progression of exercises    Status On-going               Plan - 12/31/16 1848    Clinical Impression Statement Patient progressing slowly with goals due to inconsistent attendance, chronic pain, spasms, severe injury with healing of right femur. He has limitations of decreased strength, ROM right LE and pain  limiting abiltiy to perform functional activities without difficulty. He will benefit from continued physical therapy intervention to achieve as close to full AROM right knee and be able to walk wihtout AD for improved function with household and community ambulation.     Rehab Potential Good   PT Frequency 2x / week   PT Duration 12 weeks   PT Treatment/Interventions Electrical Stimulation;Gait training;Moist Heat;Therapeutic activities;Therapeutic exercise;Balance training;Neuromuscular re-education;Patient/family education;Passive range of motion;Scar mobilization;Manual techniques   PT Next Visit Plan modalities for pain control, muscle spasms, manual soft tissue mobilization, therapeutic exercise for ROM, gait   PT Home Exercise Plan continue with SLR, knee flexion, extension, weight shifting       Patient will benefit from skilled therapeutic intervention in order to improve the following deficits and impairments:  Decreased strength, Decreased balance, Hypomobility, Impaired flexibility, Pain, Impaired perceived functional ability, Decreased activity tolerance, Decreased endurance, Increased muscle spasms, Difficulty walking, Decreased range of motion  Visit Diagnosis: Stiffness of right knee, not elsewhere classified  Muscle weakness (generalized)  Pain in right leg  Difficulty in walking, not elsewhere classified  Other muscle spasm     Problem List There are no active problems to display for  this patient.   Beacher MayBrooks, Ilyana Manuele PT 12/31/2016, 7:06 PM  [place] [address] [place], [place], [address] Phone: [phone]   Fax:  [phone]  Name: Fabienne BrunsMatthew T Dyke MRN: [id number] Date of Birth: 1981/11/01

## 2017-01-05 ENCOUNTER — Encounter: Payer: Self-pay | Admitting: Physical Therapy

## 2017-01-05 ENCOUNTER — Ambulatory Visit: Admitting: Physical Therapy

## 2017-01-05 DIAGNOSIS — R262 Difficulty in walking, not elsewhere classified: Secondary | ICD-10-CM

## 2017-01-05 DIAGNOSIS — M25661 Stiffness of right knee, not elsewhere classified: Secondary | ICD-10-CM

## 2017-01-05 DIAGNOSIS — M6281 Muscle weakness (generalized): Secondary | ICD-10-CM

## 2017-01-05 DIAGNOSIS — M79604 Pain in right leg: Secondary | ICD-10-CM

## 2017-01-06 NOTE — Therapy (Signed)
[facility] [address] [place], [address] Phone: [phone]   Fax:  [phone]  Physical Therapy Treatment  Patient Details  Name: Peter Stanley MRN: [id number] Date of Birth: 1981-07-24 Referring Provider: Abram Sander MD  Encounter Date: 01/05/2017      PT End of Session - 01/05/17 1837    Visit Number 33   Number of Visits 49   Date for PT Re-Evaluation 02/10/17   PT Start Time 1811   PT Stop Time 1855   PT Time Calculation (min) 44 min   Activity Tolerance Patient limited by pain;Patient tolerated treatment well   Behavior During Therapy [facility] for tasks assessed/performed      Past Medical History:  Diagnosis Date  . Allergy   . Anxiety     History reviewed. No pertinent surgical history.  There were no vitals filed for this visit.      Subjective Assessment - 01/05/17 1812    Subjective Patient reports arriving late due to traffic at house and unable to get out of driveway. He reports he felt better following previous session with soreness in thigh but overall improved flexibility.    Limitations Sitting;Standing;Walking;House hold activities;Other (comment)   Patient Stated Goals to be able to perform personal care activites and return to normal activites without limitaitons (prior level of function)   Currently in Pain? Yes   Pain Score 6    Pain Location Leg  thigh, knee   Pain Orientation Right   Pain Descriptors / Indicators Aching;Tightness   Pain Type Acute pain;Chronic pain  surgery: 05/30/16   Pain Onset More than a month ago   Pain Frequency Intermittent      Objective: Gait: ambulating with 2 axillary crutches WBAT RLE with decreased weight shift to right, decreased hip/knee flexion AAROM right knee flexion 0-50 degrees  Palpation: decreased soft tissue elasticity with increased tenderness to palpation superior and inferior to patella  Treatment:   Patella mobilization/distraction with glides superior/inferior and medial/lateral x 3 sets Modalities: Ultrasound 100% @ 1.0wcm2 x 10 min applied to patella tendon and superior to patella right LE: goal improve tissue mobility, pain, ROM  Therapeutic exercises: Patient performed with assistance, VC of therapist:  Assisted hip/knee flexion/extension with patient in reclined supine position; multiple sets with increasing knee flexion and extension Isometric hamstring sets multi angle with mild to moderate resistance 5 sets with end range hold x 5 reps  Modalities: Electrical stimulation; Russian stim. 10/10 cycle right VMO/quadriceps, intensity to tolerance; high volt estim. Applied with (2) electrodes placed superior and inferior to patella over spasms, intensity to tolerance x .With patient in reclined position with pillow under right knee/LE;Goal: muscle re education, pain control, reduce spasms, improve ROM  Patient response to treatment: Patient demonstrated improved soft tissue elasticity and able to tolerate exercises with less pain following [place] treatment. Increased knee flexion to 60 degrees following [place] and exercise with assistance of therapist. Patient reported decreased pain in thigh to 4/10 following estim. And able to ambulate with less difficulty at end of session.           PT Education - 01/05/17 1845    Education provided Yes   Education Details continue with frequent ROM exercises and walk as tolerated with increasing weight bearing right LE as able   Person(s) Educated Patient   Methods Explanation   Comprehension Verbalized understanding  PT Long Term Goals - 12/31/16 1846      PT LONG TERM GOAL #1   Title Patient will improve AAROM right knee up to 90 degrees by 12/14/2016 to allow him to sit and walk with more normal gait pattern and to allow decreased pain in knee   Baseline AAROM right knee 0-10 degrees flexion with pain;  10/26/16 0-50 degreed flexion; 65 degrees flextion 11/19/16; 12/31/16 60 degrees flexion   Status On-going     PT LONG TERM GOAL #2   Title patient will improve LEFS to 40/80 by 01/13/17 to demonstrate improving function with daily activities    Baseline LEFS 0/80; 10/26/16 deferred; 11/19/16 25/80   Status Deferred     PT LONG TERM GOAL #3   Title patient will have pain level max of 5/10 by 01/13/2017 to allow improved function with moving, sitting and walking   Baseline pain level max 10/10 with aggravating activities; 10/26/16 no pain reported unless moving right leg/knee then increases to 8-10/10   Status On-going     PT LONG TERM GOAL #4   Title imrpoved LEFS to 50/80 or better by 02/10/2017 indicating improved function with daily tasks/walking/personal care   Baseline LEFS 25/80   Status On-going     PT LONG TERM GOAL #5   Title patient will be able to walk with least restrictive device safely on level surfaces and stairs by 01/13/2017 to improve function with household and community activities   Baseline walking with rolling walker, WBAT RLE (non weight bearing due to pain); ambulating with axillary crutches, weaning to one crutch 11/19/16   Status On-going     PT LONG TERM GOAL #6   Title patient will be independent with home program for pain control, flexibiltiy, strength for right LE by discharge 02/10/17 in order to continue to improve function with self management   Baseline requires assistance and guidance for all exercises, limited knowledge of appropriate pain control strategies, progression of exercises    Status On-going               Plan - 01/05/17 1858    Clinical Impression Statement Limited session exercise today due to being  late to appointment. Patient demonstrated improved flexibility in right knee with repeated assisted hip and knee flexion. He reported decreased pain in right thigh and knee with estim. and verbalized good understanding of exercises for home. He  continues with significant stiffenss in right knee and hip, increased pain and decreased strength that contribute to decreased function with ambulation and being able to perform household chores. He will required additional physical thearpy intervention to achieve maximal gains in ROM and strength in order to improve functional ambulation without AD.    Rehab Potential Good   PT Frequency 2x / week   PT Duration 12 weeks   PT Next Visit Plan modalities for pain control, muscle spasms, manual soft tissue mobilization, therapeutic exercise for ROM, gait   PT Home Exercise Plan continue with SLR, knee flexion, extension, weight shifting       Patient will benefit from skilled therapeutic intervention in order to improve the following deficits and impairments:  Decreased strength, Decreased balance, Hypomobility, Impaired flexibility, Pain, Impaired perceived functional ability, Decreased activity tolerance, Decreased endurance, Increased muscle spasms, Difficulty walking, Decreased range of motion  Visit Diagnosis: Stiffness of right knee, not elsewhere classified  Muscle weakness (generalized)  Pain in right leg  Difficulty in walking, not elsewhere classified     Problem List  There are no active problems to display for this patient.   Beacher May PT 01/06/2017, 5:07 PM  [place] [facility]TS [facility]IC[facility] [address], [place], [address] Phone: [phone]   Fax:  [phone]  Name: Peter Stanley MRN: [id number] Date of Birth: 1981/05/09

## 2017-01-07 ENCOUNTER — Ambulatory Visit: Admitting: Physical Therapy

## 2017-01-07 ENCOUNTER — Encounter: Payer: Self-pay | Admitting: Physical Therapy

## 2017-01-07 DIAGNOSIS — R262 Difficulty in walking, not elsewhere classified: Secondary | ICD-10-CM

## 2017-01-07 DIAGNOSIS — M6281 Muscle weakness (generalized): Secondary | ICD-10-CM

## 2017-01-07 DIAGNOSIS — M25661 Stiffness of right knee, not elsewhere classified: Secondary | ICD-10-CM

## 2017-01-07 DIAGNOSIS — M79604 Pain in right leg: Secondary | ICD-10-CM

## 2017-01-07 DIAGNOSIS — M62838 Other muscle spasm: Secondary | ICD-10-CM

## 2017-01-08 NOTE — Therapy (Signed)
[facility] [facility] [address] [place], [place], [address] Phone: [phone]   Fax:  [phone]  Physical Therapy Treatment  Patient Details  Name: Peter BrunsMatthew T Kuennen MRN: [id number] Date of Birth: 01-31-1981 Referring Provider: Abram Sanderpatterson, jennifer MD  Encounter Date: 01/07/2017      PT End of Session - 01/07/17 1835    Visit Number 34   Number of Visits 49   Date for PT Re-Evaluation 02/10/17   PT Start Time 1745   PT Stop Time 1828   PT Time Calculation (min) 43 min   Activity Tolerance Patient limited by pain;Patient tolerated treatment well   [facility] assessed/performed      Past Medical History:  Diagnosis Date  . Allergy   . Anxiety     History reviewed. No pertinent surgical history.  There were no vitals filed for this visit.      Subjective Assessment - 01/07/17 1756    Subjective Patient reports he has been exercising at home with ROM and is sore on arrival to clinicl today. He is now able to bend his knee with less difficulty and pain however he cannot walk without crutches for long distances.    Limitations Sitting;Standing;Walking;House hold activities;Other (comment)   Patient Stated Goals to be able to perform personal care activites and return to normal activites without limitaitons (prior level of function)   Currently in Pain? Yes   Pain Score 5    Pain Orientation Right   Pain Descriptors / Indicators Aching   Pain Type Acute pain;Chronic pain   Pain Onset More than a month ago   Pain Frequency Intermittent      Objective: Gait: ambulating with 2 axillary crutches WBAT RLE with decreased weight shift to right, decreased hip/knee flexion AAROM right knee flexion 0-50 degrees pre treatment; 0-60 post treatment Palpation: decreased soft tissue elasticity with increased tenderness to palpation superior and inferior to patella and medial aspect of right  knee  Treatment:  Manual therapy: Patella mobilization/distraction with glides superior/inferior and medial/lateral x 3 sets STM right quadriceps, patella tendon superficial techniques; patient supine reclined position; goal improve soft tissue elasticity, decrease spasms   Therapeutic exercises: Patient performed with assistance, VC of therapist:  Assisted hip/knee flexion/extension with patient in reclined supine position; multiple sets with increasing knee flexion and extension; increased pain reported with end range flexion Isometric hamstring sets multi angle with mild to moderate resistance 5 sets with end range hold x 5 reps;  SLR with holding position x 5 seconds, 5 reps with assist of therapist Hip abduction with assist of therapist with isometric hold at end ROM, 10 reps  Modalities: Electrical stimulation; Russian stim. 10/10 cycle right VMO/quadriceps, intensity to tolerance; high volt estim. Applied with (2) electrodes placed superior and inferior to patella over spasms, intensity to tolerance x 15min.With patient in reclined position with pillow under right knee/LE;Goal: muscle re education, pain control, reduce spasms, improve ROM  Patient response to treatment: Patient able to perform exercises with improved motor control and decreased assistance and VC of therapist. Improved soft tissue elasticity in quadriceps with pain, improved ROM to 60 degrees following treatment. Increased pain with performing exercises; patient reported decreased pain in thigh/right LE by at least 30% following treatment with electrical stimulation.         PT Education - 01/07/17 1833    Education provided Yes   Education Details HEP; continue to work on ROM and  strengthening sitting and supine lying   Person(s) Educated Patient   Methods Explanation;Demonstration;Verbal cues   Comprehension Verbalized understanding;Returned demonstration;Verbal cues required             PT  Long Term Goals - 12/31/16 1846      PT LONG TERM GOAL #1   Title Patient will improve AAROM right knee up to 90 degrees by 12/14/2016 to allow him to sit and walk with more normal gait pattern and to allow decreased pain in knee   Baseline AAROM right knee 0-10 degrees flexion with pain; 10/26/16 0-50 degreed flexion; 65 degrees flextion 11/19/16; 12/31/16 60 degrees flexion   Status On-going     PT LONG TERM GOAL #2   Title patient will improve LEFS to 40/80 by 01/13/17 to demonstrate improving function with daily activities    Baseline LEFS 0/80; 10/26/16 deferred; 11/19/16 25/80   Status Deferred     PT LONG TERM GOAL #3   Title patient will have pain level max of 5/10 by 01/13/2017 to allow improved function with moving, sitting and walking   Baseline pain level max 10/10 with aggravating activities; 10/26/16 no pain reported unless moving right leg/knee then increases to 8-10/10   Status On-going     PT LONG TERM GOAL #4   Title imrpoved LEFS to 50/80 or better by 02/10/2017 indicating improved function with daily tasks/walking/personal care   Baseline LEFS 25/80   Status On-going     PT LONG TERM GOAL #5   Title patient will be able to walk with least restrictive device safely on level surfaces and stairs by 01/13/2017 to improve function with household and community activities   Baseline walking with rolling walker, WBAT RLE (non weight bearing due to pain); ambulating with axillary crutches, weaning to one crutch 11/19/16   Status On-going     PT LONG TERM GOAL #6   Title patient will be independent with home program for pain control, flexibiltiy, strength for right LE by discharge 02/10/17 in order to continue to improve function with self management   Baseline requires assistance and guidance for all exercises, limited knowledge of appropriate pain control strategies, progression of exercises    Status On-going               Plan - 01/07/17 1845    Clinical Impression  Statement Patient demonstrates improving strength as demonstrated with SLR without assistance, able to perform hip abduction with moderate resistance and knee flexion with moderate resistance within available ROM. He continues with pain in thigh, limited ROM right knee and limited functional ambulation and will benefit from continued physical therapy intervention.    Rehab Potential Good   PT Frequency 2x / week   PT Duration 12 weeks   PT Treatment/Interventions Electrical Stimulation;Gait training;Moist Heat;Therapeutic activities;Therapeutic exercise;Balance training;Neuromuscular re-education;Patient/family education;Passive range of motion;Scar mobilization;Manual techniques   PT Next Visit Plan modalities for pain control, muscle spasms, manual soft tissue mobilization, therapeutic exercise for ROM, gait   PT Home Exercise Plan continue with SLR, knee flexion, extension, weight shifting       Patient will benefit from skilled therapeutic intervention in order to improve the following deficits and impairments:  Decreased strength, Decreased balance, Hypomobility, Impaired flexibility, Pain, Impaired perceived functional ability, Decreased activity tolerance, Decreased endurance, Increased muscle spasms, Difficulty walking, Decreased range of motion  Visit Diagnosis: Stiffness of right knee, not elsewhere classified  Muscle weakness (generalized)  Pain in right leg  Difficulty in walking, not elsewhere classified  Other muscle spasm     Problem List There are no active problems to display for this patient.   Beacher May PT 01/08/2017, 7:44 PM  [place] [facility] PHYSICAL AND SPORTS MEDICINE [address], [place], [address] Phone: [phone]   Fax:  [phone]  Name: KHING BELCHER MRN: [id number] Date of Birth: 1981-08-11

## 2017-01-11 ENCOUNTER — Ambulatory Visit: Admitting: Physical Therapy

## 2017-01-14 ENCOUNTER — Ambulatory Visit: Payer: Self-pay | Admitting: Physical Therapy

## 2017-01-19 ENCOUNTER — Ambulatory Visit: Payer: Self-pay | Attending: Orthopedic Surgery | Admitting: Physical Therapy

## 2017-01-19 ENCOUNTER — Encounter: Payer: Self-pay | Admitting: Physical Therapy

## 2017-01-19 DIAGNOSIS — M79604 Pain in right leg: Secondary | ICD-10-CM | POA: Insufficient documentation

## 2017-01-19 DIAGNOSIS — M62838 Other muscle spasm: Secondary | ICD-10-CM | POA: Insufficient documentation

## 2017-01-19 DIAGNOSIS — R262 Difficulty in walking, not elsewhere classified: Secondary | ICD-10-CM | POA: Insufficient documentation

## 2017-01-19 DIAGNOSIS — M6281 Muscle weakness (generalized): Secondary | ICD-10-CM | POA: Insufficient documentation

## 2017-01-19 DIAGNOSIS — M25661 Stiffness of right knee, not elsewhere classified: Secondary | ICD-10-CM | POA: Insufficient documentation

## 2017-01-20 NOTE — Therapy (Signed)
[place] [facility] [address], [place], [address] Phone: [phone]   Fax:  [phone]  Physical Therapy Treatment  Patient Details  Name: Peter Stanley MRN: [id number] Date of Birth: Dec 05, 1980 Referring Provider: Abram Sander MD  Encounter Date: 01/19/2017      PT End of Session - 01/19/17 1739    Visit Number 35   Number of Visits 49   Date for PT Re-Evaluation 02/10/17   PT Start Time 1735   PT Stop Time 1820   PT Time Calculation (min) 45 min   Activity Tolerance Patient limited by pain;Patient tolerated treatment well   Behavior During Therapy [facility] for tasks assessed/performed      Past Medical History:  Diagnosis Date  . Allergy   . Anxiety     History reviewed. No pertinent surgical history.  There were no vitals filed for this visit.      Subjective Assessment - 01/19/17 1735    Subjective Patient reports he has been exercising at home with ROM Drove a truck for the first time yesterday. He is still very stiff in right knee and is planning to make an appointment with MD to determine if he will need a manipulation of his knee in order to regain full motion. He is also working on weight shifting and walking for a few feet without use of crutches.     Limitations Sitting;Standing;Walking;House hold activities;Other (comment)   Patient Stated Goals to be able to perform personal care activites and return to normal activites without limitaitons (prior level of function)   Currently in Pain? Yes   Pain Score 4    Pain Location Leg   Pain Orientation Right   Pain Descriptors / Indicators Tightness   Pain Type Acute pain;Chronic pain   Pain Onset More than a month ago   Pain Frequency Intermittent      Objective: Gait: ambulating with 2 axillary crutches WBAT RLE improving hip/knee flexion and transfer of weight to right LE AAROM right knee flexion 0-50 degrees pre treatment; 0-60  post treatment with pain limiting further motion Palpation: improving soft tissue mobility right knee superior, inferior to patella as compared to previous session  Treatment:  Manual therapy: Patella mobilization/distraction with glides superior/inferior and medial/lateral x 3 sets STM right quadriceps, patella tendon superficial techniques; patient supine reclined position; goal improve soft tissue elasticity, decrease spasms   Therapeutic exercises: Patient performed with assistance, VC of therapist:  Assisted hip/knee flexion/extension with patient in reclined supine position; multiple sets with increasing knee flexion and extension; increased pain reported with end range flexion Isometric hamstring sets multi angle with mild to moderate resistance 5 sets with end range hold x 5 reps;  SLR with holding position x 5 seconds, 5 reps with assist of therapist Sitting at edge of treatment table performed assisted knee flexion multiple sets through available ROM with pain limiting further motion  Modalities: Electrical stimulation; Russian stim. 10/10 cycle right VMO/quadriceps, hamstring musclesintensity to tolerance; x .With patient in reclined position with pillow under right knee/LE;Goal: muscle re education, pain control, reduce spasms, improve ROM  Patient response to treatment: Pain limits ability to perform flexion exercises >60 degrees. Patient demonstrated improved soft tissue elasticity with STM. Patient with decreased pain in right knee 30% following estim.          PT Education - 01/19/17 1737    Education provided Yes   Education Details HEP: working on bending and weight  bearing on LE   Person(s) Educated Patient   Methods Explanation;Demonstration;Verbal cues   Comprehension Verbalized understanding;Returned demonstration;Verbal cues required             PT Long Term Goals - 12/31/16 1846      PT LONG TERM GOAL #1   Title Patient will improve  AAROM right knee up to 90 degrees by 12/14/2016 to allow him to sit and walk with more normal gait pattern and to allow decreased pain in knee   Baseline AAROM right knee 0-10 degrees flexion with pain; 10/26/16 0-50 degreed flexion; 65 degrees flextion 11/19/16; 12/31/16 60 degrees flexion   Status On-going     PT LONG TERM GOAL #2   Title patient will improve LEFS to 40/80 by 01/13/17 to demonstrate improving function with daily activities    Baseline LEFS 0/80; 10/26/16 deferred; 11/19/16 25/80   Status Deferred     PT LONG TERM GOAL #3   Title patient will have pain level max of 5/10 by 01/13/2017 to allow improved function with moving, sitting and walking   Baseline pain level max 10/10 with aggravating activities; 10/26/16 no pain reported unless moving right leg/knee then increases to 8-10/10   Status On-going     PT LONG TERM GOAL #4   Title imrpoved LEFS to 50/80 or better by 02/10/2017 indicating improved function with daily tasks/walking/personal care   Baseline LEFS 25/80   Status On-going     PT LONG TERM GOAL #5   Title patient will be able to walk with least restrictive device safely on level surfaces and stairs by 01/13/2017 to improve function with household and community activities   Baseline walking with rolling walker, WBAT RLE (non weight bearing due to pain); ambulating with axillary crutches, weaning to one crutch 11/19/16   Status On-going     PT LONG TERM GOAL #6   Title patient will be independent with home program for pain control, flexibiltiy, strength for right LE by discharge 02/10/17 in order to continue to improve function with self management   Baseline requires assistance and guidance for all exercises, limited knowledge of appropriate pain control strategies, progression of exercises    Status On-going               Plan - 01/19/17 1815    Clinical Impression Statement Patient demonstrates improvement in soft tissue elasticity right knee and continues with  significant stiffness and limited joint mobiltiy right knee. He is responding slowly to therapy intervention and requires assistance to perform exercises to improve knee flexion. Recommend follow up with surgeon to discuss possibiltiy of manipulation to improve motion right knee when and if appropriate.      Rehab Potential Good   PT Frequency 2x / week   PT Duration 12 weeks   PT Treatment/Interventions Electrical Stimulation;Gait training;Moist Heat;Therapeutic activities;Therapeutic exercise;Balance training;Neuromuscular re-education;Patient/family education;Passive range of motion;Scar mobilization;Manual techniques   PT Next Visit Plan modalities for pain control, muscle spasms, manual soft tissue mobilization, therapeutic exercise for ROM, gait   PT Home Exercise Plan continue with SLR, knee flexion, extension, weight shifting       Patient will benefit from skilled therapeutic intervention in order to improve the following deficits and impairments:  Decreased strength, Decreased balance, Hypomobility, Impaired flexibility, Pain, Impaired perceived functional ability, Decreased activity tolerance, Decreased endurance, Increased muscle spasms, Difficulty walking, Decreased range of motion  Visit Diagnosis: Stiffness of right knee, not elsewhere classified  Muscle weakness (generalized)  Pain in right leg  Difficulty in walking, not elsewhere classified  Other muscle spasm     Problem List There are no active problems to display for this patient.   Beacher MayBrooks, Kendle Turbin PT  01/20/2017, 11:29 PM  [place] [facility] [address] [place], [place], [address] Phone: [phone]   Fax:  [phone]  Name: Peter Stanley MRN: [id number] Date of Birth: 09-14-1981

## 2017-01-21 ENCOUNTER — Ambulatory Visit: Payer: Self-pay | Admitting: Physical Therapy

## 2017-01-21 DIAGNOSIS — M6281 Muscle weakness (generalized): Secondary | ICD-10-CM

## 2017-01-21 DIAGNOSIS — M62838 Other muscle spasm: Secondary | ICD-10-CM

## 2017-01-21 DIAGNOSIS — M25661 Stiffness of right knee, not elsewhere classified: Secondary | ICD-10-CM

## 2017-01-21 DIAGNOSIS — R262 Difficulty in walking, not elsewhere classified: Secondary | ICD-10-CM

## 2017-01-21 DIAGNOSIS — M79604 Pain in right leg: Secondary | ICD-10-CM

## 2017-01-22 NOTE — Therapy (Signed)
[facility] [facility] [address] [place], [place], [address] Phone: [phone]   Fax:  [phone]  Physical Therapy Treatment  Patient Details  Name: Peter BrunsMatthew T Stanley MRN: [id number] Date of Birth: [birth year] Referring Provider: Abram Sanderpatterson, jennifer MD  Encounter Date: 01/21/2017      PT End of Session - 01/21/17 1900    Visit Number 36   Number of Visits 49   Date for PT Re-Evaluation 02/10/17   PT Start Time 1755   PT Stop Time 1835   PT Time Calculation (min) 40 min   Activity Tolerance Patient limited by pain;Patient tolerated treatment well   Behavior [facility] for tasks assessed/performed      Past Medical History:  Diagnosis Date  . Allergy   . Anxiety     No past surgical history on file.  There were no vitals filed for this visit.      Subjective Assessment - 01/21/17 1800    Subjective Patient reports he was better with less pain wiht lying in bed with knee bent following previous session. He is diligently working on bending his knee and it is still very stiff. He is now able to walk with one crutch.   Limitations Sitting;Standing;Walking;House hold activities;Other (comment)   Patient Stated Goals to be able to perform personal care activites and return to normal activites without limitaitons (prior level of function)   Currently in Pain? Yes   Pain Score 3    Pain Location Knee   Pain Orientation Right;Anterior   Pain Descriptors / Indicators Tightness;Aching   Pain Type Chronic pain;Acute pain   Pain Onset More than a month ago   Pain Frequency Intermittent      Objective: Gait: ambulating with 1 axillary crutch WBAT RLE improving hip/knee flexion and transfer of weight to right LE(crutch in right hand: re instructed to use crutch in left hand to increase balance, BOS)  AAROM right knee flexion 0-45/50 degrees pre treatment; 0-60 post treatment with pain limiting further  motion Palpation: improving soft tissue mobility right knee superior, inferior to patella as compared to previous session; increased thickness along patella tendon inferior to knee  Treatment:  Manual therapy: Patella mobilization/distraction with glides superior/inferior and medial/lateral x 3 sets STM right quadriceps, patella tendon superficial techniques; patient sitting; goal improve soft tissue elasticity, decrease spasms Right tibiofemoral joint mobilizations: femur on tibia with assist of Loralyn FreshwaterMiguel Laygo, PT 3 reps grade 3 glides with patient seated with knee in flexion: goal: ROM   Therapeutic exercises: Patient performed with assistance, VC of therapist:  Assistedknee flexion/extension with patient in sitting: multiple sets with increasing knee flexion and extension; increased pain reported with end range flexion Isometric hamstring sets multi angle with mild to moderate resistance 5 sets with end range hold x 5 reps;  Sitting at edge of treatment table performed assisted knee flexion multiple sets through available ROM with pain limiting further motion  Modalities: Electrical stimulation; Russian stim. 10/10 cycle right VMO/quadriceps, hamstring musclesintensity to tolerance; x 15min.With patient in reclined position with pillow under right knee/LE;Goal: muscle re education, pain control, reduce spasms, improve ROM  Patient response to treatment: Patient with improved AAROM 10 degrees with treatment. He continues with pain,, spasms with attempting to increased right knee flexion. He requires assistance to achieve end range flexion right knee. He reports decreased pain and spasms by at least  30% and improved flexibility in right knee following estim./STM.  PT Education - 01/21/17 1845    Education provided Yes   Education Details HEP; stretching at end range flexion to increased motion right knee   Person(s) Educated Patient   Methods Explanation;Demonstration    Comprehension Verbalized understanding;Returned demonstration             PT Long Term Goals - 12/31/16 1846      PT LONG TERM GOAL #1   Title Patient will improve AAROM right knee up to 90 degrees by 12/14/2016 to allow him to sit and walk with more normal gait pattern and to allow decreased pain in knee   Baseline AAROM right knee 0-10 degrees flexion with pain; 10/26/16 0-50 degreed flexion; 65 degrees flextion 11/19/16; 12/31/16 60 degrees flexion   Status On-going     PT LONG TERM GOAL #2   Title patient will improve LEFS to 40/80 by 01/13/17 to demonstrate improving function with daily activities    Baseline LEFS 0/80; 10/26/16 deferred; 11/19/16 25/80   Status Deferred     PT LONG TERM GOAL #3   Title patient will have pain level max of 5/10 by 01/13/2017 to allow improved function with moving, sitting and walking   Baseline pain level max 10/10 with aggravating activities; 10/26/16 no pain reported unless moving right leg/knee then increases to 8-10/10   Status On-going     PT LONG TERM GOAL #4   Title imrpoved LEFS to 50/80 or better by 02/10/2017 indicating improved function with daily tasks/walking/personal care   Baseline LEFS 25/80   Status On-going     PT LONG TERM GOAL #5   Title patient will be able to walk with least restrictive device safely on level surfaces and stairs by 01/13/2017 to improve function with household and community activities   Baseline walking with rolling walker, WBAT RLE (non weight bearing due to pain); ambulating with axillary crutches, weaning to one crutch 11/19/16   Status On-going     PT LONG TERM GOAL #6   Title patient will be independent with home program for pain control, flexibiltiy, strength for right LE by discharge 02/10/17 in order to continue to improve function with self management   Baseline requires assistance and guidance for all exercises, limited knowledge of appropriate pain control strategies, progression of exercises     Status On-going               Plan - 01/21/17 1844    Clinical Impression Statement Patient with improved soft tissue elasticity and imrpoved ROM by 10 degrees with treatment today. He remains with significant decreased active and passive ROM right knee and will require additional physical therapy to improve ROM/strength to return to as close to prior level of function as able.    Rehab Potential Good   PT Frequency 2x / week   PT Duration 12 weeks   PT Treatment/Interventions Electrical Stimulation;Gait training;Moist Heat;Therapeutic activities;Therapeutic exercise;Balance training;Neuromuscular re-education;Patient/family education;Passive range of motion;Scar mobilization;Manual techniques   PT Next Visit Plan modalities for pain control, muscle spasms, manual soft tissue mobilization, therapeutic exercise for ROM, gait   PT Home Exercise Plan continue with SLR, knee flexion, extension, weight shifting       Patient will benefit from skilled therapeutic intervention in order to improve the following deficits and impairments:  Decreased strength, Decreased balance, Hypomobility, Impaired flexibility, Pain, Impaired perceived functional ability, Decreased activity tolerance, Decreased endurance, Increased muscle spasms, Difficulty walking, Decreased range of motion  Visit Diagnosis: Stiffness of right knee, not elsewhere classified  Muscle weakness (generalized)  Pain in right leg  Difficulty in walking, not elsewhere classified  Other muscle spasm     Problem List There are no active problems to display for this patient.   Beacher May PT 01/22/2017, 2:07 PM  [place] [facility] [address], [place], [address] Phone: [phone]   Fax:  [phone]  Name: MASTON WIGHT MRN: [id number] Date of Birth: [birth year]

## 2017-01-25 ENCOUNTER — Ambulatory Visit: Payer: Self-pay | Admitting: Physical Therapy

## 2017-01-27 ENCOUNTER — Ambulatory Visit: Payer: Self-pay | Admitting: Physical Therapy

## 2017-01-28 ENCOUNTER — Ambulatory Visit: Payer: Self-pay | Admitting: Physical Therapy

## 2017-01-28 ENCOUNTER — Encounter: Payer: Self-pay | Admitting: Physical Therapy

## 2017-01-28 DIAGNOSIS — M79604 Pain in right leg: Secondary | ICD-10-CM

## 2017-01-28 DIAGNOSIS — M62838 Other muscle spasm: Secondary | ICD-10-CM

## 2017-01-28 DIAGNOSIS — R262 Difficulty in walking, not elsewhere classified: Secondary | ICD-10-CM

## 2017-01-28 DIAGNOSIS — M25661 Stiffness of right knee, not elsewhere classified: Secondary | ICD-10-CM

## 2017-01-28 DIAGNOSIS — M6281 Muscle weakness (generalized): Secondary | ICD-10-CM

## 2017-01-28 NOTE — Therapy (Signed)
[place] [facility] [address], [place], [address] Phone: [phone]   Fax:  [phone]  Physical Therapy Treatment  Patient Details  Name: Peter Stanley MRN: [id number] Date of Birth: 09-Mar-1981 Referring Provider: Abram Sander MD  Encounter Date: 01/28/2017      PT End of Session - 01/28/17 1959    Visit Number 37   Number of Visits 49   Date for PT Re-Evaluation 02/10/17   PT Start Time 1925   PT Stop Time 2005   PT Time Calculation (min) 40 min   Activity Tolerance Patient limited by pain;Patient tolerated treatment well   Behavior During Therapy [facility] for tasks assessed/performed      Past Medical History:  Diagnosis Date  . Allergy   . Anxiety     History reviewed. No pertinent surgical history.  There were no vitals filed for this visit.      Subjective Assessment - 01/28/17 1954    Subjective Patient reports he was transferring sit to stand today in bathroom and right leg knee gave way. He reports his knee bent and he over stretched his inner thigh and hamstring. He reports previous session with mobilization techniques femur on tibia helped quite a bit to improve joint flexibility   Limitations Sitting;Standing;Walking;House hold activities;Other (comment)   Patient Stated Goals to be able to perform personal care activites and return to normal activites without limitaitons (prior level of function)   Currently in Pain? Yes   Pain Score 8    Pain Location Leg   Pain Orientation Right;Medial  inner thigh and knee where he bent his knee   Pain Descriptors / Indicators Tightness;Aching;Spasm  weakness   Pain Type Chronic pain;Acute pain   Pain Onset More than a month ago   Pain Frequency Constant      Objective: Gait: ambulating with 2 axillary crutches WBAT RLE decreased weight bearing today and NWB RLE at times due to pain in right LE AAROM right knee flexion 0-45 degrees  pre treatment; 0-50 post treatment with pain limiting further motion Palpation: point tender inferior aspect right patella, medial thigh along hamstring, adductor muscles today  Treatment:  Manual therapy: Patella mobilization/distraction with glides superior/inferior and medial/lateral x 3 sets STM right quadriceps, patella tendon superficial techniques; patient supine with right LE elevated on pillow; goal improve soft tissue elasticity, decrease spasms  Therapeutic exercises: Patient performed with assistance, VC of therapist:  Assistedknee flexion/extension with patient supine partially reclined: multiple sets with increasing knee flexion and extension; increased pain reported with end range flexion  Modalities: Electrical stimulation; Russian stim. 10/10 cycle right VMO/quadriceps, high volt to hamstring musclesintensity to tolerance; x .With patient in reclined position with pillow under right knee/LE;Goal: muscle re education, pain control, reduce spasms, improve ROM Patient response to treatment: patient reported decreased pain in right LE by 50% following electrical stimulation and manual STM.           PT Education - 01/28/17 1958    Education provided Yes   Education Details HEP; continue with ambulating with/without crutches as needed   Person(s) Educated Patient   Methods Explanation   Comprehension Verbalized understanding             PT Long Term Goals - 01/28/17 2002      PT LONG TERM GOAL #1   Title Patient will improve AAROM right knee up to 90 degrees by 02/10/2017 to allow him to sit and walk with  more normal gait pattern and to allow decreased pain in knee   Baseline AAROM right knee 0-10 degrees flexion with pain; 10/26/16 0-50 degreed flexion; 65 degrees flextion 11/19/16; 12/31/16 60 degrees flexion   Status On-going     PT LONG TERM GOAL #2   Title patient will improve LEFS to 40/80 by 01/13/17 to demonstrate improving function with  daily activities    Baseline LEFS 0/80; 10/26/16 deferred; 11/19/16 25/80   Status Deferred     PT LONG TERM GOAL #3   Title patient will have pain level max of 5/10 by 02/10/2017 to allow improved function with moving, sitting and walking   Baseline pain level max 10/10 with aggravating activities; 10/26/16 no pain reported unless moving right leg/knee then increases to 8-10/10   Status On-going     PT LONG TERM GOAL #4   Title imrpoved LEFS to 50/80 or better by 02/10/2017 indicating improved function with daily tasks/walking/personal care   Baseline LEFS 25/80   Status On-going     PT LONG TERM GOAL #5   Title patient will be able to walk with least restrictive device safely on level surfaces and stairs by 02/10/2017 to improve function with household and community activities   Baseline walking with rolling walker, WBAT RLE (non weight bearing due to pain); ambulating with axillary crutches, weaning to one crutch 11/19/16; 01/28/17 walking with one crutch and intermittently no crutches   Status On-going     PT LONG TERM GOAL #6   Title patient will be independent with home program for pain control, flexibiltiy, strength for right LE by discharge 02/10/17 in order to continue to improve function with self management   Baseline requires assistance and guidance for all exercises, limited knowledge of appropriate pain control strategies, progression of exercises    Status On-going               Plan - 01/28/17 2000    Clinical Impression Statement Patient with acute pain in right LE due to partial giving way today with transfers. Improved pain and spasms following treatment.    Rehab Potential Good   PT Frequency 2x / week   PT Duration 12 weeks   PT Treatment/Interventions Electrical Stimulation;Gait training;Moist Heat;Therapeutic activities;Therapeutic exercise;Balance training;Neuromuscular re-education;Patient/family education;Passive range of motion;Scar mobilization;Manual  techniques   PT Next Visit Plan modalities for pain control, muscle spasms, manual soft tissue mobilization, therapeutic exercise for ROM, gait   PT Home Exercise Plan continue with SLR, knee flexion, extension, weight shifting and pain control      Patient will benefit from skilled therapeutic intervention in order to improve the following deficits and impairments:  Decreased strength, Decreased balance, Hypomobility, Impaired flexibility, Pain, Impaired perceived functional ability, Decreased activity tolerance, Decreased endurance, Increased muscle spasms, Difficulty walking, Decreased range of motion  Visit Diagnosis: Stiffness of right knee, not elsewhere classified  Muscle weakness (generalized)  Pain in right leg  Difficulty in walking, not elsewhere classified  Other muscle spasm     Problem List There are no active problems to display for this patient.   Beacher MayBrooks, Chantele Corado PT 01/29/2017, 3:44 PM  [place] [facility] [address] [place], [place], [address] Phone: [phone]   Fax:  [phone]  Name: Peter Stanley MRN: [id number] Date of Birth: 23-Jan-1981

## 2017-02-01 ENCOUNTER — Ambulatory Visit: Payer: Self-pay | Admitting: Physical Therapy

## 2017-02-01 ENCOUNTER — Encounter: Payer: Self-pay | Admitting: Physical Therapy

## 2017-02-01 DIAGNOSIS — M62838 Other muscle spasm: Secondary | ICD-10-CM

## 2017-02-01 DIAGNOSIS — R262 Difficulty in walking, not elsewhere classified: Secondary | ICD-10-CM

## 2017-02-01 DIAGNOSIS — M6281 Muscle weakness (generalized): Secondary | ICD-10-CM

## 2017-02-01 DIAGNOSIS — M79604 Pain in right leg: Secondary | ICD-10-CM

## 2017-02-01 DIAGNOSIS — M25661 Stiffness of right knee, not elsewhere classified: Secondary | ICD-10-CM

## 2017-02-01 NOTE — Therapy (Signed)
[place] [facility] PH[facility] AND SPOR[facility] MEDICINE [address] [place], [place], [address] Phone: [phone]   Fax:  [phone]  Physical Therapy Treatment  Patient Details  Name: Peter Stanley MRN: [id number] Date of Birth: Dec 15, 1980 Referring Provider: Abram Stanley, Peter Stanley  Encounter Date: 02/01/2017      PT End of Session - 02/01/17 1920    Visit Number 38   Number of Visits 49   Date for PT Re-Evaluation 02/10/17   PT Start Time 1848   PT Stop Time 1938   PT Time Calculation (min) 50 min   Activity Tolerance Patient limited by pain;Patient tolerated treatment well   Behavior During Therapy [facility] for tasks assessed/performed      Past Medical History:  Diagnosis Date  . Allergy   . Anxiety     History reviewed. No pertinent surgical history.  There were no vitals filed for this visit.      Subjective Assessment - 02/01/17 1849    Subjective Patient is walking with one crutch and trying to put more weight on right LE. He reports tightness lateral thigh; right LE "fell asleep" last night in bed; he repositioned his leg and feeling came back. He is going to call surgeon regarding possible manipulation of his knee.    Limitations Sitting;Standing;Walking;House hold activities;Other (comment)   Patient Stated Goals to be able to perform personal care activites and return to normal activites without limitaitons (prior level of function)   Currently in Pain? Yes   Pain Score 6    Pain Location Leg   Pain Orientation Right   Pain Descriptors / Indicators Aching;Tightness   Pain Type Chronic pain;Acute pain   Pain Onset More than a month ago   Pain Frequency Constant      Objective: Gait: ambulating with 1 axillary crutch WBAT RLE improving hip/knee flexion and transfer of weight to right LE AAROM right knee flexion 0-45 degrees pre treatment; 0-55 post treatment with pain limiting further motion Palpation: soft tissue mobility  right LE decreased with moderate spasms lateral aspect right thigh lateral quadriceps  Treatment: Manual therapy: Patella mobilization/distraction with glides superior/inferior and medial/lateral x 3 sets STM right quadriceps, superficial techniques; patient sitting; goal improve soft tissue elasticity, decrease spasms Right tibiofemoral joint mobilizations: PA mobilizations 3 reps grade 3 glides with patient seated with knee in flexion: goal: ROM Therapeutic exercises: Patient performed with assistance, VC of therapist:  Assisted right knee flexion/extension with patient inreclined sitting: multiple sets with increasing knee flexion and extension; increased pain reported with end range flexion Isometric hamstring sets multi angle with mild to moderate resistance 5 sets with end range hold x 5 reps;  Modalities: Electrical stimulation; Russian stim. 10/10 cycle right VMO/quadriceps,intensity to tolerance/contraction (50ma); high volt estim. To lateral quadriceps right LE, intensity to tolerance 250 volts  x 20min.With patient in reclined position with pillow under right knee/LE;Goal: muscle re education, pain control, reduce spasms, improve ROM  Patient response to treatment: Patient with improved soft tissue elasticity by >50% lateral quadriceps. Patient improved flexibility right knee flexion 10 degrees with hamstring sets. He reports decreased pain in right thigh from 6/10 to 4/10 following STM, modalities.          PT Education - 02/01/17 1850    Education provided Yes   Education Details HEP: continue to exercise as instructed to improved flexibility and ROM    Person(s) Educated Patient   Methods Explanation   Comprehension Verbalized understanding  PT Long Term Goals - 01/28/17 2002      PT LONG TERM GOAL #1   Title Patient will improve AAROM right knee up to 90 degrees by 02/10/2017 to allow him to sit and walk with more normal gait pattern and to allow  decreased pain in knee   Baseline AAROM right knee 0-10 degrees flexion with pain; 10/26/16 0-50 degreed flexion; 65 degrees flextion 11/19/16; 12/31/16 60 degrees flexion   Status On-going     PT LONG TERM GOAL #2   Title patient will improve LEFS to 40/80 by 01/13/17 to demonstrate improving function with daily activities    Baseline LEFS 0/80; 10/26/16 deferred; 11/19/16 25/80   Status Deferred     PT LONG TERM GOAL #3   Title patient will have pain level max of 5/10 by 02/10/2017 to allow improved function with moving, sitting and walking   Baseline pain level max 10/10 with aggravating activities; 10/26/16 no pain reported unless moving right leg/knee then increases to 8-10/10   Status On-going     PT LONG TERM GOAL #4   Title imrpoved LEFS to 50/80 or better by 02/10/2017 indicating improved function with daily tasks/walking/personal care   Baseline LEFS 25/80   Status On-going     PT LONG TERM GOAL #5   Title patient will be able to walk with least restrictive device safely on level surfaces and stairs by 02/10/2017 to improve function with household and community activities   Baseline walking with rolling walker, WBAT RLE (non weight bearing due to pain); ambulating with axillary crutches, weaning to one crutch 11/19/16; 01/28/17 walking with one crutch and intermittently no crutches   Status On-going     PT LONG TERM GOAL #6   Title patient will be independent with home program for pain control, flexibiltiy, strength for right LE by discharge 02/10/17 in order to continue to improve function with self management   Baseline requires assistance and guidance for all exercises, limited knowledge of appropriate pain control strategies, progression of exercises    Status On-going               Plan - 02/01/17 1921    Clinical Impression Statement Patient demonstrated improved soft tissue elasticity with STM with decreased pain to 4/10 lateral aspect of right LE. He continues with  significant stiffness in right knee with limited motion and spasm/pain limiting further motion.  He is going to schedule an appointment with surgeon to discuss possible manipulation to improve knee motion.    Rehab Potential Good   PT Frequency 2x / week   PT Duration 12 weeks   PT Treatment/Interventions Electrical Stimulation;Gait training;Moist Heat;Therapeutic activities;Therapeutic exercise;Balance training;Neuromuscular re-education;Patient/family education;Passive range of motion;Scar mobilization;Manual techniques   PT Next Visit Plan modalities for pain control, muscle spasms, manual soft tissue mobilization, therapeutic exercise for ROM, gait   PT Home Exercise Plan continue with SLR, knee flexion, extension, weight shifting and pain control      Patient will benefit from skilled therapeutic intervention in order to improve the following deficits and impairments:  Decreased strength, Decreased balance, Hypomobility, Impaired flexibility, Pain, Impaired perceived functional ability, Decreased activity tolerance, Decreased endurance, Increased muscle spasms, Difficulty walking, Decreased range of motion  Visit Diagnosis: Stiffness of right knee, not elsewhere classified  Muscle weakness (generalized)  Pain in right leg  Difficulty in walking, not elsewhere classified  Other muscle spasm     Problem List There are no active problems to display for this patient.  Beacher May PT 02/01/2017, 7:39 PM  [place] [facility] PHYSICAL AND SPORTS MEDICINE [address] [place], [address] Phone: [phone]   Fax:  [phone]  Name: Peter Stanley MRN: [id number] Date of Birth: September 28, 1981

## 2017-02-03 ENCOUNTER — Ambulatory Visit: Payer: Self-pay | Admitting: Physical Therapy

## 2017-02-04 ENCOUNTER — Ambulatory Visit: Payer: Self-pay | Admitting: Physical Therapy

## 2017-02-04 ENCOUNTER — Encounter: Payer: Self-pay | Admitting: Physical Therapy

## 2017-02-04 DIAGNOSIS — M79604 Pain in right leg: Secondary | ICD-10-CM

## 2017-02-04 DIAGNOSIS — M25661 Stiffness of right knee, not elsewhere classified: Secondary | ICD-10-CM

## 2017-02-04 DIAGNOSIS — R262 Difficulty in walking, not elsewhere classified: Secondary | ICD-10-CM

## 2017-02-04 DIAGNOSIS — M62838 Other muscle spasm: Secondary | ICD-10-CM

## 2017-02-04 DIAGNOSIS — M6281 Muscle weakness (generalized): Secondary | ICD-10-CM

## 2017-02-04 NOTE — Therapy (Signed)
[place] [facility] [address] [place], [place], [address] Phone: [phone]   Fax:  [phone]  Physical Therapy Treatment  Patient Details  Name: Peter BrunsMatthew T Mato MRN: [id number] Date of Birth: 08/27/1981 Referring Provider: Abram Sanderpatterson, jennifer MD  Encounter Date: 02/04/2017      PT End of Session - 02/04/17 1914    Visit Number 39   Number of Visits 49   Date for PT Re-Evaluation 02/10/17   PT Start Time 1825   PT Stop Time 1920   PT Time Calculation (min) 55 min   Activity Tolerance Patient limited by pain;Patient tolerated treatment well   Behavior During Therapy [facility] for tasks assessed/performed      Past Medical History:  Diagnosis Date  . Allergy   . Anxiety     History reviewed. No pertinent surgical history.  There were no vitals filed for this visit.      Subjective Assessment - 02/04/17 1832    Subjective Patient reports he is still working to improve right knee flexion, limited due to pain and he has an appointment with MD next week to discuss possible need for manipulation to assist with regaining knee flexion.     Limitations Sitting;Standing;Walking;House hold activities;Other (comment)   Patient Stated Goals to be able to perform personal care activites and return to normal activites without limitaitons (prior level of function)   Currently in Pain? Yes   Pain Score 4    Pain Location Leg   Pain Orientation Right   Pain Descriptors / Indicators Aching;Tightness   Pain Type Chronic pain   Pain Onset More than a month ago   Pain Frequency Intermittent      Objective: Gait: ambulating with 1axillary crutch WBAT RLE improving hip/knee flexion and transfer of weight to right LE AAROM right knee flexion 0-50degrees pre treatment; 0-60post treatment with pain limiting further motion Palpation: soft tissue mobility right LE decreased with moderate spasms lateral aspect right thigh  quadriceps, decreased quadriceps length right LE Joint mobility right knee decreased AP mobility tibiofemoral joint, difficult to fully assess due to pain  Treatment: Manual therapy: Patella mobilization/distraction with glides superior/inferior and medial/lateral x 3 sets STM right quadriceps, hamstring and calf muscles superficial techniques; patient sitting; goal improve soft tissue elasticity, decrease spasms, improve ROM Right tibiofemoral joint mobilizations: PA mobilizations 3 reps grade 3 glides with patient seated with knee in flexion: goal: ROM Therapeutic exercises: Patient performed with assistance, VC of therapist:  Assisted right knee flexion/extension with patient in reclined sitting: multiple sets with increasing knee flexion and extension; increased pain reported with end range flexion Prone lying active knee flexion, contract relax to quadriceps followed by AAROM into knee flexion 5 sets/reps Modalities: Electrical stimulation; Russian stim. 10/10 cycle right VMO/quadriceps,intensity to tolerance/contraction (50ma); high volt estim. To lateral quadriceps right LE, intensity to tolerance 250 volts  x 20min.With patient in reclined position with wedge supporting right knee/LE with moist heat applied to same;Goal: muscle re education, pain control, reduce spasms, improve ROM  Patient response to treatment: Patient reported increased pulling and pain in anterior thigh with all stretching exercises, improved following STM/repetition. Patient with decreased pain from 4/10 to 2/10. Patient with decreased spasms and improved ROM to 60 degrees right knee flexion following STM. Patient reported improved flexibility in right knee following treatment.           PT Education - 02/04/17 1920    Education provided Yes   Education Details  HEP; stretching for quadriceps and use of heat to decrease right hip pain and knee pain   Person(s) Educated Patient   Methods  Explanation;Demonstration;Verbal cues   Comprehension Verbalized understanding;Returned demonstration;Verbal cues required             PT Long Term Goals - 01/28/17 2002      PT LONG TERM GOAL #1   Title Patient will improve AAROM right knee up to 90 degrees by 02/10/2017 to allow him to sit and walk with more normal gait pattern and to allow decreased pain in knee   Baseline AAROM right knee 0-10 degrees flexion with pain; 10/26/16 0-50 degreed flexion; 65 degrees flextion 11/19/16; 12/31/16 60 degrees flexion   Status On-going     PT LONG TERM GOAL #2   Title patient will improve LEFS to 40/80 by 01/13/17 to demonstrate improving function with daily activities    Baseline LEFS 0/80; 10/26/16 deferred; 11/19/16 25/80   Status Deferred     PT LONG TERM GOAL #3   Title patient will have pain level max of 5/10 by 02/10/2017 to allow improved function with moving, sitting and walking   Baseline pain level max 10/10 with aggravating activities; 10/26/16 no pain reported unless moving right leg/knee then increases to 8-10/10   Status On-going     PT LONG TERM GOAL #4   Title imrpoved LEFS to 50/80 or better by 02/10/2017 indicating improved function with daily tasks/walking/personal care   Baseline LEFS 25/80   Status On-going     PT LONG TERM GOAL #5   Title patient will be able to walk with least restrictive device safely on level surfaces and stairs by 02/10/2017 to improve function with household and community activities   Baseline walking with rolling walker, WBAT RLE (non weight bearing due to pain); ambulating with axillary crutches, weaning to one crutch 11/19/16; 01/28/17 walking with one crutch and intermittently no crutches   Status On-going     PT LONG TERM GOAL #6   Title patient will be independent with home program for pain control, flexibiltiy, strength for right LE by discharge 02/10/17 in order to continue to improve function with self management   Baseline requires assistance  and guidance for all exercises, limited knowledge of appropriate pain control strategies, progression of exercises    Status On-going               Plan - 02/04/17 1915    Clinical Impression Statement patient demonstrated improved soft tissue elasticity with STM with decresaed pain and improved AAROM to 60 degrees with less pain and pulling in anterior thigh. He is limited by pain and ? Decreased joint mobility.    Rehab Potential Good   PT Frequency 2x / week   PT Duration 12 weeks   PT Treatment/Interventions Electrical Stimulation;Gait training;Moist Heat;Therapeutic activities;Therapeutic exercise;Balance training;Neuromuscular re-education;Patient/family education;Passive range of motion;Scar mobilization;Manual techniques   PT Next Visit Plan modalities for pain control, muscle spasms, manual soft tissue mobilization, therapeutic exercise for ROM, gait   PT Home Exercise Plan continue with SLR, knee flexion, extension, weight shifting and pain control      Patient will benefit from skilled therapeutic intervention in order to improve the following deficits and impairments:  Decreased strength, Decreased balance, Hypomobility, Impaired flexibility, Pain, Impaired perceived functional ability, Decreased activity tolerance, Decreased endurance, Increased muscle spasms, Difficulty walking, Decreased range of motion  Visit Diagnosis: Stiffness of right knee, not elsewhere classified  Muscle weakness (generalized)  Pain in right  leg  Difficulty in walking, not elsewhere classified  Other muscle spasm     Problem List There are no active problems to display for this patient.   Beacher May PT 02/05/2017, 10:21 PM  [place] [facility] PHYSICAL AND SPORTS MEDICINE [address], [place], [address] Phone: [phone]   Fax:  [phone]  Name: YOSIAH JASMIN MRN: [id number] Date of Birth: 1981-02-28

## 2017-02-08 ENCOUNTER — Encounter: Payer: Self-pay | Admitting: Physical Therapy

## 2017-02-10 ENCOUNTER — Encounter: Payer: Self-pay | Admitting: Physical Therapy

## 2017-02-11 ENCOUNTER — Ambulatory Visit: Payer: Self-pay | Admitting: Physical Therapy

## 2017-02-11 ENCOUNTER — Encounter: Payer: Self-pay | Admitting: Physical Therapy

## 2017-02-11 DIAGNOSIS — M62838 Other muscle spasm: Secondary | ICD-10-CM

## 2017-02-11 DIAGNOSIS — R262 Difficulty in walking, not elsewhere classified: Secondary | ICD-10-CM

## 2017-02-11 DIAGNOSIS — M79604 Pain in right leg: Secondary | ICD-10-CM

## 2017-02-11 DIAGNOSIS — M6281 Muscle weakness (generalized): Secondary | ICD-10-CM

## 2017-02-11 DIAGNOSIS — M25661 Stiffness of right knee, not elsewhere classified: Secondary | ICD-10-CM

## 2017-02-11 NOTE — Therapy (Signed)
[place] PHYSICAL AND SPORTS MEDICINE [address], [place], [address] Phone: [phone]   Fax:  [phone]  Physical Therapy Treatment  Patient Details  Name: Peter Stanley MRN: [id number] Date of Birth: May 07, 1981 Referring Provider: Maureen Chatters MD  Encounter Date: 02/11/2017      PT End of Session - 02/11/17 1950    Visit Number 40   Number of Visits 52   Date for PT Re-Evaluation 03/11/17   PT Start Time 1840   PT Stop Time 1920   PT Time Calculation (min) 40 min   Activity Tolerance Patient limited by pain;Patient tolerated treatment well   Behavior During Therapy [facility] for tasks assessed/performed      Past Medical History:  Diagnosis Date  . Allergy   . Anxiety     History reviewed. No pertinent surgical history.  There were no vitals filed for this visit.      Subjective Assessment - 02/11/17 1840    Subjective Patient reports he is now trying to walk without AD and feels he is doing better with this. He still has pain and stiffness in his right knee that limits normal walking and daily activities.    Limitations Sitting;Standing;Walking;House hold activities;Other (comment)   Patient Stated Goals to be able to perform personal care activites and return to normal activites without limitaitons (prior level of function)   Currently in Pain? No/denies      Objective: Gait: ambulating independently without AD with limp on right LE.  AAROM right knee flexion 0-45degrees pre treatment; 0-55post treatment with pain limiting further motion Palpation: soft tissue mobility right LE decreased with moderate spasms lateral aspect right thigh quadriceps, decreased quadriceps length right LE Outcome measure: LEFS 30/80 (80 = no self perceived disability)  Treatment: Manual therapy: Patella mobilization/distraction with glides superior/inferior and medial/lateral x 3 sets STM right quadriceps, hamstring and  calf muscles superficial techniques; patient sitting and prone lying; goal improve soft tissue elasticity, decrease spasms, improve ROM Patient performed AROM right knee flexion/extension following STM with pain limiting motion Modalities: Electrical stimulation; Russian stim. 10/10 cycle right hamstring,intensity to tolerance/contraction (61m); high volt estim. To lateral quadriceps right LE, intensity to tolerance 200 volts x 284m.with patient prone lying with lower leg supported in slight flexion;Goal: muscle re education, pain control, reduce spasms, improve ROM  Patient response to treatment: Patient reported decreased pain in right knee/LE with improved soft tissue elasticity following treatment. He reported decreased pain in right LE and was able to walk with less difficulty and limp following treatment.          PT Education - 02/11/17 1920    Education provided Yes   Education Details HEP reassessed for ROM, strengthening right LE   Person(s) Educated Patient   Methods Explanation   Comprehension Verbalized understanding             PT Long Term Goals - 02/11/17 1954      PT LONG TERM GOAL #1   Title Patient will improve AAROM right knee up to 70 degrees by 03/11/2017 to allow him to sit and walk with more normal gait pattern and to allow decreased pain in knee   Baseline AAROM right knee 0-10 degrees flexion with pain; 10/26/16 0-50 degreed flexion; 65 degrees flextion 11/19/16; 12/31/16 60 degrees flexion 02/11/17 0-50    Status Not Met     PT LONG TERM GOAL #2   Title patient will improve LEFS to 40/80 by 03/11/17  to demonstrate improving function with daily activities    Baseline LEFS 0/80; 10/26/16 deferred; 11/19/16 25/80; 02/11/17 30/80   Status Revised     PT LONG TERM GOAL #3   Title patient will have pain level max of 5/10 by 03/11/2017 to allow improved function with moving, sitting and walking   Baseline pain level max 10/10 with aggravating activities;  10/26/16 no pain reported unless moving right leg/knee then increases to 8-10/10   Status On-going     PT LONG TERM GOAL #4   Title patient will be able to walk with least restrictive device safely on level surfaces and stairs by 03/11/2017 to improve function with household and community activities   Baseline walking with rolling walker, WBAT RLE (non weight bearing due to pain); ambulating with axillary crutches, weaning to one crutch 11/19/16; 01/28/17 walking with one crutch and intermittently no crutches   Status Revised     PT LONG TERM GOAL #5   Title patient will be independent with home program for pain control, flexibiltiy, strength for right LE by discharge 03/11/17 in order to continue to improve function with self management   Baseline requires assistance and guidance for all exercises, limited knowledge of appropriate pain control strategies, progression of exercises    Status Revised            Plan - 02/11/17 1951    Clinical Impression Statement Patient is progressing slowly with ROM, ambulation and is now beginning to ambulate without AD for short distances. He continues with severely limted ROM in right knee s/p comminuted fx right femur and decreased strength that limits functional ambulation in community and in the home. He has LEFS score of 30/80 indicating moderate self perceived disability and will benefit from additional physical therapy intervention in order to achieve goals.    Rehab Potential Good   PT Frequency 2x / week   PT Duration 4 weeks   PT Treatment/Interventions Electrical Stimulation;Gait training;Moist Heat;Therapeutic activities;Therapeutic exercise;Balance training;Neuromuscular re-education;Patient/family education;Passive range of motion;Scar mobilization;Manual techniques   PT Next Visit Plan modalities for pain control, muscle spasms, manual soft tissue mobilization, therapeutic exercise for ROM, gait   PT Home Exercise Plan continue with SLR, knee  flexion, extension, weight shifting and pain control      Patient will benefit from skilled therapeutic intervention in order to improve the following deficits and impairments:  Decreased strength, Decreased balance, Hypomobility, Impaired flexibility, Pain, Impaired perceived functional ability, Decreased activity tolerance, Decreased endurance, Increased muscle spasms, Difficulty walking, Decreased range of motion  Visit Diagnosis: Stiffness of right knee, not elsewhere classified - Plan: PT plan of care cert/re-cert  Muscle weakness (generalized) - Plan: PT plan of care cert/re-cert  Pain in right leg - Plan: PT plan of care cert/re-cert  Difficulty in walking, not elsewhere classified - Plan: PT plan of care cert/re-cert  Other muscle spasm - Plan: PT plan of care cert/re-cert     Problem List There are no active problems to display for this patient.   Jomarie Longs PT 02/12/2017, 10:26 PM  [place] PHYSICAL AND SPORTS MEDICINE [address], [place], [address] Phone: [phone]   Fax:  [phone]  Name: Peter Stanley MRN: [id number] Date of Birth: 1981-07-10

## 2017-02-16 ENCOUNTER — Ambulatory Visit: Payer: Self-pay | Attending: Orthopedic Surgery | Admitting: Physical Therapy

## 2017-02-16 ENCOUNTER — Encounter: Payer: Self-pay | Admitting: Physical Therapy

## 2017-02-16 DIAGNOSIS — M79604 Pain in right leg: Secondary | ICD-10-CM | POA: Insufficient documentation

## 2017-02-16 DIAGNOSIS — M25661 Stiffness of right knee, not elsewhere classified: Secondary | ICD-10-CM | POA: Insufficient documentation

## 2017-02-16 DIAGNOSIS — R262 Difficulty in walking, not elsewhere classified: Secondary | ICD-10-CM | POA: Insufficient documentation

## 2017-02-16 DIAGNOSIS — M6281 Muscle weakness (generalized): Secondary | ICD-10-CM | POA: Insufficient documentation

## 2017-02-16 DIAGNOSIS — M62838 Other muscle spasm: Secondary | ICD-10-CM | POA: Insufficient documentation

## 2017-02-17 NOTE — Therapy (Signed)
[facility] [address], [place], [address] Phone: [phone]   Fax:  [phone]  Physical Therapy Treatment  Patient Details  Name: Peter Stanley MRN: [id number] Date of Birth: [birth year] Referring Provider: Maureen Chatters MD  Encounter Date: 02/16/2017      PT End of Session - 02/16/17 1800    Visit Number 41   Number of Visits 52   Date for PT Re-Evaluation 03/11/17   PT Start Time 1700   PT Stop Time 4628   PT Time Calculation (min) 55 min   Activity Tolerance Patient limited by pain;Patient tolerated treatment well   Behavior During Therapy [facility] for tasks assessed/performed      Past Medical History:  Diagnosis Date  . Allergy   . Anxiety     History reviewed. No pertinent surgical history.  There were no vitals filed for this visit.      Subjective Assessment - 02/16/17 1713    Subjective Patient reports he is going for follow up with MD tomorrow and feels he is improving slowly. He would like an X ray to see progress with healing femur fracture and will discuss whether he needs a manipulation for improving knee flexion.    Limitations Sitting;Standing;Walking;House hold activities;Other (comment)   Patient Stated Goals to be able to perform personal care activites and return to normal activites without limitaitons (prior level of function)   Currently in Pain? No/denies        Objective: Gait: ambulating independently with one axillary crutch with limp on right LE.  AAROM right knee flexion 0-50degrees pre treatment; 0-55post treatment with pain limiting further motion Palpation: soft tissue mobility right LE decreased with moderate spasms lateral aspect right thigh quadriceps  Treatment: Manual therapy: Patella mobilization/distraction with glides superior/inferior and medial/lateral x 3 sets STM right quadriceps, hamstring and calf muscles superficial techniques;  patient sitting and supine lying; goal improve soft tissue elasticity, decrease spasms, improve ROM Therapeutic exercise: patient performed with assistance, VC and tactile cues, demonstration of therapist Sitting: multi angle isometrics for knee flexion right with guidance of therapist, mild to moderate graded resistance given 3 sets Patient performed AROM right knee flexion/extension in supine and long siting following STM with pain limiting motion Modalities: Electrical stimulation; Russian stim. 10/10 cycle right quadriceps,intensity to tolerance/contraction (34m); high volt estim. To lateral quadriceps right LE, intensity to tolerance 250 volts x 228m.with patient prone lying with lower leg supported in slight flexion over wedge with moist heat applied to quadriceps with estim.;Goal: muscle re education, pain control, reduce spasms, improve ROM  Patient response to treatment: Patient with improved flexibility in right knee with less pain when flexing knee following STM, estim. and moist heat. Improved gait pattern with increased weight shift to right and improved hip/knee flexion with VC at end of session.            PT Education - 02/16/17 1744    Education provided Yes   Education Details continue with exercises to improve knee flexion as tolerated. use of heat to assist with pain, improving flexibility   Person(s) Educated Patient   Methods Explanation   Comprehension Verbalized understanding             PT Long Term Goals - 02/11/17 1954      PT LONG TERM GOAL #1   Title Patient will improve AAROM right knee up to 70 degrees by 03/11/2017 to allow him to sit and walk with  more normal gait pattern and to allow decreased pain in knee   Baseline AAROM right knee 0-10 degrees flexion with pain; 10/26/16 0-50 degreed flexion; 65 degrees flextion 11/19/16; 12/31/16 60 degrees flexion 02/11/17 0-50    Status Not Met     PT LONG TERM GOAL #2   Title patient will improve LEFS  to 40/80 by 03/11/17 to demonstrate improving function with daily activities    Baseline LEFS 0/80; 10/26/16 deferred; 11/19/16 25/80; 02/11/17 30/80   Status Revised     PT LONG TERM GOAL #3   Title patient will have pain level max of 5/10 by 03/11/2017 to allow improved function with moving, sitting and walking   Baseline pain level max 10/10 with aggravating activities; 10/26/16 no pain reported unless moving right leg/knee then increases to 8-10/10   Status On-going     PT LONG TERM GOAL #4   Title patient will be able to walk with least restrictive device safely on level surfaces and stairs by 03/11/2017 to improve function with household and community activities   Baseline walking with rolling walker, WBAT RLE (non weight bearing due to pain); ambulating with axillary crutches, weaning to one crutch 11/19/16; 01/28/17 walking with one crutch and intermittently no crutches   Status Revised     PT LONG TERM GOAL #5   Title patient will be independent with home program for pain control, flexibiltiy, strength for right LE by discharge 03/11/17 in order to continue to improve function with self management   Baseline requires assistance and guidance for all exercises, limited knowledge of appropriate pain control strategies, progression of exercises    Status Revised           Plan - 02/16/17 1830    Clinical Impression Statement Patient demonstrated improved soft tissue elasticity and continues with decreased ROM right knee. He is beginning to ambulate without AD intermitently or using one axillary crutch for safety. He will benefit from addtional physical therapy intervention to further improve abiltiy to walk without AD and improve ROM as able.    Rehab Potential Good   PT Frequency 2x / week   PT Duration 4 weeks   PT Treatment/Interventions Electrical Stimulation;Gait training;Moist Heat;Therapeutic activities;Therapeutic exercise;Balance training;Neuromuscular re-education;Patient/family  education;Passive range of motion;Scar mobilization;Manual techniques   PT Next Visit Plan modalities for pain control, muscle spasms, manual soft tissue mobilization, therapeutic exercise for ROM, gait   PT Home Exercise Plan continue with SLR, knee flexion, extension, weight shifting and pain control      Patient will benefit from skilled therapeutic intervention in order to improve the following deficits and impairments:  Decreased strength, Decreased balance, Hypomobility, Impaired flexibility, Pain, Impaired perceived functional ability, Decreased activity tolerance, Decreased endurance, Increased muscle spasms, Difficulty walking, Decreased range of motion  Visit Diagnosis: Stiffness of right knee, not elsewhere classified  Muscle weakness (generalized)  Pain in right leg  Difficulty in walking, not elsewhere classified     Problem List There are no active problems to display for this patient.   Jomarie Longs PT 02/17/2017, 10:03 PM  [place] [facility] [address], [place], [address] Phone: [phone]   Fax:  [phone]  Name: Peter Stanley MRN: [id number] Date of Birth: 12-31-1980

## 2017-02-18 ENCOUNTER — Ambulatory Visit: Payer: Self-pay | Admitting: Physical Therapy

## 2017-02-23 ENCOUNTER — Encounter: Payer: Self-pay | Admitting: Physical Therapy

## 2017-02-23 ENCOUNTER — Ambulatory Visit: Payer: Self-pay | Admitting: Physical Therapy

## 2017-02-23 DIAGNOSIS — R262 Difficulty in walking, not elsewhere classified: Secondary | ICD-10-CM

## 2017-02-23 DIAGNOSIS — M6281 Muscle weakness (generalized): Secondary | ICD-10-CM

## 2017-02-23 DIAGNOSIS — M79604 Pain in right leg: Secondary | ICD-10-CM

## 2017-02-23 DIAGNOSIS — M25661 Stiffness of right knee, not elsewhere classified: Secondary | ICD-10-CM

## 2017-02-23 NOTE — Therapy (Signed)
[facility] [address] [place], [address] Phone: [phone]   Fax:  [phone]  Physical Therapy Treatment  Patient Details  Name: Peter Stanley MRN: [id number] Date of Birth: 1981/02/06 Referring Provider: Maureen Chatters MD  Encounter Date: 02/23/2017      PT End of Session - 02/23/17 1740    Visit Number 42   Number of Visits 52   Date for PT Re-Evaluation 03/11/17   PT Start Time 2119   PT Stop Time 1756   PT Time Calculation (min) 41 min   Activity Tolerance Patient limited by pain;Patient tolerated treatment well   Behavior During Therapy [facility] for tasks assessed/performed      Past Medical History:  Diagnosis Date  . Allergy   . Anxiety     History reviewed. No pertinent surgical history.  There were no vitals filed for this visit.      Subjective Assessment - 02/23/17 1737    Subjective Patient reports he was seen by MD and X rays were taken and right femur is still healing and he will be evaluated in July to determine the need for manipulation.    Limitations Sitting;Standing;Walking;House hold activities;Other (comment)   Patient Stated Goals to be able to perform personal care activites and return to normal activites without limitaitons (prior level of function)   Currently in Pain? No/denies  sore with weight bearing      Objective: Gait: ambulating independently with one axillary crutch with limp on right LE, increase base of support AAROM right knee flexion 0-50 degrees pre treatment; 0-50post treatment with pain limiting further motion Palpation: soft tissue mobility right LE decreased with moderate spasms lateral aspect right thigh quadriceps  Treatment: Manual therapy: Patella mobilization/distraction with glides superior/inferior and medial/lateral x 3 sets STM right quadriceps, hamstring and calf muscles superficial techniques; patient sitting and supine  lying; goal improve soft tissue elasticity, decrease spasms, improve ROM Therapeutic exercise: patient performed with assistance, VC and tactile cues, demonstration of therapist Sitting: multi angle isometrics for knee flexion right with guidance of therapist, mild to moderate graded resistance given 3 sets Patient performed AROM right knee flexion/extension in supine and long siting following STM with pain limiting motion Modalities: Electrical stimulation; Russian stim. 10/10 cycle right quadriceps,intensity to tolerance/contraction (74m); high volt estim. To lateral quadriceps right LE, intensity to tolerance 250 volts x 167m.with patient supine lying with lower leg supported on pillow with moist heat applied to quadriceps with estim.;Goal: muscle re education, pain control, reduce spasms, improve ROM  Patient response to treatment: Patient with improved soft tissue elasticity right LE quadriceps, hamstring muscles following STM. Improved quadriceps control following estim. Patient required assistance and VC to perform exercises with correct technique and alignment.        PT Education - 02/23/17 1750    Education provided Yes   Education Details HEP: continue with ROM and quad control exercises and ambulation with one crutch   Person(s) Educated Patient   Methods Explanation;Demonstration   Comprehension Verbalized understanding;Returned demonstration             PT Long Term Goals - 02/11/17 1954      PT LONG TERM GOAL #1   Title Patient will improve AAROM right knee up to 70 degrees by 03/11/2017 to allow him to sit and walk with more normal gait pattern and to allow decreased pain in knee   Baseline AAROM right knee 0-10 degrees flexion with pain;  10/26/16 0-50 degreed flexion; 65 degrees flextion 11/19/16; 12/31/16 60 degrees flexion 02/11/17 0-50    Status Not Met     PT LONG TERM GOAL #2   Title patient will improve LEFS to 40/80 by 03/11/17 to demonstrate improving  function with daily activities    Baseline LEFS 0/80; 10/26/16 deferred; 11/19/16 25/80; 02/11/17 30/80   Status Revised     PT LONG TERM GOAL #3   Title patient will have pain level max of 5/10 by 03/11/2017 to allow improved function with moving, sitting and walking   Baseline pain level max 10/10 with aggravating activities; 10/26/16 no pain reported unless moving right leg/knee then increases to 8-10/10   Status On-going     PT LONG TERM GOAL #4   Title patient will be able to walk with least restrictive device safely on level surfaces and stairs by 03/11/2017 to improve function with household and community activities   Baseline walking with rolling walker, WBAT RLE (non weight bearing due to pain); ambulating with axillary crutches, weaning to one crutch 11/19/16; 01/28/17 walking with one crutch and intermittently no crutches   Status Revised     PT LONG TERM GOAL #5   Title patient will be independent with home program for pain control, flexibiltiy, strength for right LE by discharge 03/11/17 in order to continue to improve function with self management   Baseline requires assistance and guidance for all exercises, limited knowledge of appropriate pain control strategies, progression of exercises    Status Revised            Plan - 02/23/17 1740    Clinical Impression Statement Patient demonstrates improvement with pain, abiltiy to walk with decreased assistance, one crutch. He continues with decreased strength, flexibility and increased pain as he continues to heal from femur fracture, s/p surgery. He should continue progress with additional physical therapy intervention.    Rehab Potential Good   PT Frequency 2x / week   PT Duration 4 weeks   PT Treatment/Interventions Electrical Stimulation;Gait training;Moist Heat;Therapeutic activities;Therapeutic exercise;Balance training;Neuromuscular re-education;Patient/family education;Passive range of motion;Scar mobilization;Manual techniques    PT Next Visit Plan modalities for pain control, muscle spasms, manual soft tissue mobilization, therapeutic exercise for ROM, gait   PT Home Exercise Plan continue with SLR, knee flexion, extension, weight shifting and pain control      Patient will benefit from skilled therapeutic intervention in order to improve the following deficits and impairments:  Decreased strength, Decreased balance, Hypomobility, Impaired flexibility, Pain, Impaired perceived functional ability, Decreased activity tolerance, Decreased endurance, Increased muscle spasms, Difficulty walking, Decreased range of motion  Visit Diagnosis: Stiffness of right knee, not elsewhere classified  Muscle weakness (generalized)  Pain in right leg  Difficulty in walking, not elsewhere classified     Problem List There are no active problems to display for this patient.   Jomarie Longs PT 02/24/2017, 9:53 PM  [place] [facility] [address] [place], [address] Phone: [phone]   Fax:  [phone]  Name: Peter Stanley MRN: [id number] Date of Birth: 06/09/1981

## 2017-02-25 ENCOUNTER — Ambulatory Visit: Payer: Self-pay | Admitting: Physical Therapy

## 2017-02-25 ENCOUNTER — Encounter: Payer: Self-pay | Admitting: Physical Therapy

## 2017-02-25 DIAGNOSIS — M62838 Other muscle spasm: Secondary | ICD-10-CM

## 2017-02-25 DIAGNOSIS — M25661 Stiffness of right knee, not elsewhere classified: Secondary | ICD-10-CM

## 2017-02-25 DIAGNOSIS — M6281 Muscle weakness (generalized): Secondary | ICD-10-CM

## 2017-02-25 DIAGNOSIS — M79604 Pain in right leg: Secondary | ICD-10-CM

## 2017-02-25 DIAGNOSIS — R262 Difficulty in walking, not elsewhere classified: Secondary | ICD-10-CM

## 2017-02-26 NOTE — Therapy (Signed)
[facility] [address], [place], [address] Phone: [phone]   Fax:  [phone]  Physical Therapy Treatment  Patient Details  Name: Peter Stanley MRN: [id number] Date of Birth: 1981-08-25 Referring Provider: Maureen Chatters MD  Encounter Date: 02/25/2017      PT End of Session - 02/25/17 1830    Visit Number 42   Number of Visits 52   Date for PT Re-Evaluation 03/11/17   PT Start Time 3875   PT Stop Time 1820   PT Time Calculation (min) 36 min   Activity Tolerance Patient limited by pain;Patient tolerated treatment well   Behavior During Therapy [facility] for tasks assessed/performed      Past Medical History:  Diagnosis Date  . Allergy   . Anxiety     History reviewed. No pertinent surgical history.  There were no vitals filed for this visit.      Subjective Assessment - 02/25/17 1817    Subjective Patient reports increased pain along right thigh and spasms that are limiting him from performing ROM exercises. He is working onf pain control and ROm as tolerated at home.    Limitations Sitting;Standing;Walking;House hold activities;Other (comment)   Patient Stated Goals to be able to perform personal care activites and return to normal activites without limitaitons (prior level of function)   Currently in Pain? Yes   Pain Score 7    Pain Location Leg   Pain Orientation Right   Pain Descriptors / Indicators Aching;Tightness   Pain Type Chronic pain   Pain Onset More than a month ago   Pain Frequency Intermittent      Objective: Gait: ambulating independently with one axillary crutchwith limp on right LE, increased base of support Palpation: soft tissue mobility right LE decreased with moderate spasms lateral aspect right thigh quadriceps  Treatment: Manual therapy: Patella mobilization/distraction with glides superior/inferior and medial/lateral x 3 sets STM right quadriceps,  hamstring and calf muscles x 15 min.superficial techniques; patient reclinedlying; goal improve soft tissue elasticity, decrease spasms, improve ROM Modalities: Electrical stimulation; Russian stim. 10/10 cycle right quadriceps,intensity to tolerance/contraction (62m); high volt estim. to lateral quadriceps right LE, intensity to tolerance 230 volts x 150m.with patient supine lying with lower leg supported on pillow with moist heat applied to quadriceps with estim.;Goal: muscle re education, pain control, reduce spasms, improve ROM  Patient response to treatment: Patient reported decreased pain from 7/10 to 5/10 following treatment. Improved soft tissue elasticity >50% following soft tissue mobilization. Improved motor control of quadriceps with electrical stimulation. Improved ability to stand and walk with decreased pain at end of session.           PT Education - 02/25/17 1825    Education provided Yes   Education Details instructed in use of heat/ice and continue with exercises for ROM/strength as able   Person(s) Educated Patient   Methods Explanation;Demonstration   Comprehension Verbalized understanding;Returned demonstration             PT Long Term Goals - 02/11/17 1954      PT LONG TERM GOAL #1   Title Patient will improve AAROM right knee up to 70 degrees by 03/11/2017 to allow him to sit and walk with more normal gait pattern and to allow decreased pain in knee   Baseline AAROM right knee 0-10 degrees flexion with pain; 10/26/16 0-50 degreed flexion; 65 degrees flextion 11/19/16; 12/31/16 60 degrees flexion 02/11/17 0-50    Status Not  Met     PT LONG TERM GOAL #2   Title patient will improve LEFS to 40/80 by 03/11/17 to demonstrate improving function with daily activities    Baseline LEFS 0/80; 10/26/16 deferred; 11/19/16 25/80; 02/11/17 30/80   Status Revised     PT LONG TERM GOAL #3   Title patient will have pain level max of 5/10 by 03/11/2017 to allow improved  function with moving, sitting and walking   Baseline pain level max 10/10 with aggravating activities; 10/26/16 no pain reported unless moving right leg/knee then increases to 8-10/10   Status On-going     PT LONG TERM GOAL #4   Title patient will be able to walk with least restrictive device safely on level surfaces and stairs by 03/11/2017 to improve function with household and community activities   Baseline walking with rolling walker, WBAT RLE (non weight bearing due to pain); ambulating with axillary crutches, weaning to one crutch 11/19/16; 01/28/17 walking with one crutch and intermittently no crutches   Status Revised     PT LONG TERM GOAL #5   Title patient will be independent with home program for pain control, flexibiltiy, strength for right LE by discharge 03/11/17 in order to continue to improve function with self management   Baseline requires assistance and guidance for all exercises, limited knowledge of appropriate pain control strategies, progression of exercises    Status Revised            Plan - 02/25/17 1825    Clinical Impression Statement Patient demonstrated improved soft tissue elasticity and able to stand and put weight through right LE with significant decreased pain following treamtent. He continues with limitations of decreased strength and ROM and decreased function with ambulation as he is still healing from injury, fracture and will benefit from additional physical therapy intervention.    Rehab Potential Good   PT Frequency 2x / week   PT Duration 4 weeks   PT Treatment/Interventions Electrical Stimulation;Gait training;Moist Heat;Therapeutic activities;Therapeutic exercise;Balance training;Neuromuscular re-education;Patient/family education;Passive range of motion;Scar mobilization;Manual techniques   PT Next Visit Plan modalities for pain control, muscle spasms, manual soft tissue mobilization, therapeutic exercise for ROM, gait   PT Home Exercise Plan  continue with SLR, knee flexion, extension, weight shifting and pain control      Patient will benefit from skilled therapeutic intervention in order to improve the following deficits and impairments:  Decreased strength, Decreased balance, Hypomobility, Impaired flexibility, Pain, Impaired perceived functional ability, Decreased activity tolerance, Decreased endurance, Increased muscle spasms, Difficulty walking, Decreased range of motion  Visit Diagnosis: Stiffness of right knee, not elsewhere classified  Muscle weakness (generalized)  Pain in right leg  Difficulty in walking, not elsewhere classified  Other muscle spasm     Problem List There are no active problems to display for this patient.   Jomarie Longs PT 02/26/2017, 8:44 PM  [facility] [address] [place], [address] Phone: [phone]   Fax:  [phone]  Name: Peter Stanley MRN: [id number] Date of Birth: 07-Jul-1981

## 2017-03-01 ENCOUNTER — Ambulatory Visit: Payer: Self-pay | Admitting: Physical Therapy

## 2017-03-01 DIAGNOSIS — M6281 Muscle weakness (generalized): Secondary | ICD-10-CM

## 2017-03-01 DIAGNOSIS — M62838 Other muscle spasm: Secondary | ICD-10-CM

## 2017-03-01 DIAGNOSIS — R262 Difficulty in walking, not elsewhere classified: Secondary | ICD-10-CM

## 2017-03-01 DIAGNOSIS — M79604 Pain in right leg: Secondary | ICD-10-CM

## 2017-03-01 DIAGNOSIS — M25661 Stiffness of right knee, not elsewhere classified: Secondary | ICD-10-CM

## 2017-03-02 NOTE — Therapy (Signed)
[facility] [address] [place], [address] Phone: [phone]   Fax:  [phone]  Physical Therapy Treatment  Patient Details  Name: Peter Stanley MRN: [id number] Date of Birth: [birth year] Referring Provider: Maureen Chatters MD  Encounter Date: 03/01/2017      PT End of Session - 03/01/17 1823    Visit Number 43   Number of Visits 52   Date for PT Re-Evaluation 03/11/17   PT Start Time 2119   PT Stop Time 1823   PT Time Calculation (min) 49 min   Activity Tolerance Patient limited by pain;Patient tolerated treatment well   Behavior During Therapy [facility] for tasks assessed/performed      Past Medical History:  Diagnosis Date  . Allergy   . Anxiety     No past surgical history on file.  There were no vitals filed for this visit.      Subjective Assessment - 03/01/17 1735    Subjective Patient reports his right thigh has decreased spasms and pain today as compared to previous session. He is trying to exercise his knee more to gain flexion and is walking with one crutch or household environment for support and is having less LE pain in general. He feels therapy is still very beneficial.    Limitations Sitting;Standing;Walking;House hold activities;Other (comment)   Patient Stated Goals to be able to perform personal care activites and return to normal activites without limitaitons (prior level of function)   Currently in Pain? Yes   Pain Score 6    Pain Location Leg   Pain Orientation Right   Pain Descriptors / Indicators Aching;Tightness   Pain Type Chronic pain   Pain Onset More than a month ago   Pain Frequency Intermittent      Objective: Gait: ambulating independently with one axillary crutchwith limp on right LE, increased base of support Palpation: soft tissue mobility right LE decreased with moderate spasms lateral aspect right thigh quadriceps and tender with decreased soft  tissue mobility in hamstring muscles AAROM: right knee flexion sitting 0 - 50 degrees with pain/spasms limiting further motion  Treatment: Manual therapy: Right knee: Patella mobilization/distraction with glides superior/inferior and medial/lateral x 3 sets STM right quadriceps, patella tendon, hamstring and calf muscles x 15 min.superficial techniques; patient reclined supine, sitting and pronelying; goal improve soft tissue elasticity, decrease spasms, improve ROM Therapeutic exercise: Patient performed exercises to right LE with assistance, verbal and tactile cuing of therapist:goal; improve ROM, decrease pain, improved functional ambulation Patient sitting at edge of treatment table: with assistance of therapist performed knee flexion extension right knee multiple sets 3-5 reps Patient prone lying; right LE knee flexion performed by patient with assist of therapist with isometric hold at full flexion and mild stretch with increased pain and spasm in anterior aspect of patella/superior to patella limiting further motion. Multiple reps performed Modalities: Electrical stimulation; Russian stim. 10/10 cycle right quadriceps,intensity to tolerance/contraction (66m); high volt estim. to lateral quadriceps right LE, intensity to tolerance 230 volts x 237m.with patient supinelying with lower leg supported on pillowwith moist heat applied to quadriceps with estim.;Goal: muscle re education, pain control, reduce spasms, improve ROM  Patient response to treatment: Patient demonstrated improved soft tissue mobility with improved tolerance to knee flexion with less discomfort by >50% per patient report. Improved quadriceps activation, control following Russian stim.  Patient reported decreased pain level to 3-4/10 at end of session with improved ability to weight bear through right  LE.         PT Education - 03/01/17 1821    Education provided Yes   Education Details HEP; self massage with  massage roller, demonstrated by therapist, continue with AROM and stretching as tolerated to gain flexion right knee   Person(s) Educated Patient   Methods Explanation;Demonstration;Verbal cues   Comprehension Verbalized understanding;Returned demonstration;Verbal cues required             PT Long Term Goals - 02/11/17 1954      PT LONG TERM GOAL #1   Title Patient will improve AAROM right knee up to 70 degrees by 03/11/2017 to allow him to sit and walk with more normal gait pattern and to allow decreased pain in knee   Baseline AAROM right knee 0-10 degrees flexion with pain; 10/26/16 0-50 degreed flexion; 65 degrees flextion 11/19/16; 12/31/16 60 degrees flexion 02/11/17 0-50    Status Not Met     PT LONG TERM GOAL #2   Title patient will improve LEFS to 40/80 by 03/11/17 to demonstrate improving function with daily activities    Baseline LEFS 0/80; 10/26/16 deferred; 11/19/16 25/80; 02/11/17 30/80   Status Revised     PT LONG TERM GOAL #3   Title patient will have pain level max of 5/10 by 03/11/2017 to allow improved function with moving, sitting and walking   Baseline pain level max 10/10 with aggravating activities; 10/26/16 no pain reported unless moving right leg/knee then increases to 8-10/10   Status On-going     PT LONG TERM GOAL #4   Title patient will be able to walk with least restrictive device safely on level surfaces and stairs by 03/11/2017 to improve function with household and community activities   Baseline walking with rolling walker, WBAT RLE (non weight bearing due to pain); ambulating with axillary crutches, weaning to one crutch 11/19/16; 01/28/17 walking with one crutch and intermittently no crutches   Status Revised     PT LONG TERM GOAL #5   Title patient will be independent with home program for pain control, flexibiltiy, strength for right LE by discharge 03/11/17 in order to continue to improve function with self management   Baseline requires assistance and  guidance for all exercises, limited knowledge of appropriate pain control strategies, progression of exercises    Status Revised              Plan - 03/01/17 1830    Clinical Impression Statement Patient demonstrates improvement with soft tissuse elasticity and decreased spasms and pain in right thigh and knee with physical therapy intervention. He continues with limited ROM right knee flexion with pain and spasms limiting further motion. He has decresaed functional ambulation and is currently using one axillary crutch as he continues to heal from right femur fracture.   Rehab Potential Good   PT Frequency 2x / week   PT Duration 4 weeks   PT Treatment/Interventions Electrical Stimulation;Gait training;Moist Heat;Therapeutic activities;Therapeutic exercise;Balance training;Neuromuscular re-education;Patient/family education;Passive range of motion;Scar mobilization;Manual techniques   PT Next Visit Plan modalities for pain control, muscle spasms, manual soft tissue mobilization, therapeutic exercise for ROM, gait   PT Home Exercise Plan continue with SLR, knee flexion, extension, weight shifting and pain control      Patient will benefit from skilled therapeutic intervention in order to improve the following deficits and impairments:  Decreased strength, Decreased balance, Hypomobility, Impaired flexibility, Pain, Impaired perceived functional ability, Decreased activity tolerance, Decreased endurance, Increased muscle spasms, Difficulty walking, Decreased range of  motion  Visit Diagnosis: Stiffness of right knee, not elsewhere classified  Muscle weakness (generalized)  Pain in right leg  Difficulty in walking, not elsewhere classified  Other muscle spasm     Problem List There are no active problems to display for this patient.   Jomarie Longs PT 03/02/2017, 4:29 PM  [facility] [address] [place], [address] Phone: [phone]   Fax:  [phone]  Name: Peter Stanley MRN: [id number] Date of Birth: 1981-04-04

## 2017-03-03 ENCOUNTER — Ambulatory Visit: Payer: Self-pay | Admitting: Physical Therapy

## 2017-03-03 ENCOUNTER — Encounter: Payer: Self-pay | Admitting: Physical Therapy

## 2017-03-03 DIAGNOSIS — M6281 Muscle weakness (generalized): Secondary | ICD-10-CM

## 2017-03-03 DIAGNOSIS — M25661 Stiffness of right knee, not elsewhere classified: Secondary | ICD-10-CM

## 2017-03-03 DIAGNOSIS — R262 Difficulty in walking, not elsewhere classified: Secondary | ICD-10-CM

## 2017-03-03 DIAGNOSIS — M79604 Pain in right leg: Secondary | ICD-10-CM

## 2017-03-03 NOTE — Therapy (Signed)
[facility] [address] [place], [address] Phone: [phone]   Fax:  [phone]  Physical Therapy Treatment  Patient Details  Name: Peter Stanley MRN: [id number] Date of Birth: 18-Jun-1981 Referring Provider: Abram Sander MD  Encounter Date: 03/03/2017      PT End of Session - 03/03/17 1807    Visit Number 45   Number of Visits 52   Date for PT Re-Evaluation 03/11/17   PT Start Time 1736   PT Stop Time 1820   PT Time Calculation (min) 44 min   Activity Tolerance Patient limited by pain;Patient tolerated treatment well   Behavior During Therapy [facility] for tasks assessed/performed      Past Medical History:  Diagnosis Date  . Allergy   . Anxiety     History reviewed. No pertinent surgical history.  There were no vitals filed for this visit.      Subjective Assessment - 03/03/17 1806    Subjective Patieint reports he is improving flexibility in right thigh. He feels therapy is helping to improve pain, right knee flexibiltity and strength.   Limitations Sitting;Standing;Walking;House hold activities;Other (comment)   Patient Stated Goals to be able to perform personal care activites and return to normal activites without limitaitons (prior level of function)   Currently in Pain? No/denies      Objective: Gait: ambulating independently with one axillary crutchwith limp on right LE, increasedbase of support Palpation: soft tissue mobility right LE decreased with moderate spasms lateral aspect right thigh quadriceps and tender with decreased soft tissue mobility in hamstring muscles AAROM: right knee flexion 0-45/50 degrees pre/post treatment  Treatment: Manual therapy: Right knee: Patella mobilization/distraction with glides superior/inferior and medial/lateral x 3 sets STM right quadriceps, patella tendon, hamstring and calf muscles x 15 min.superficial techniques; patient  reclined supine, sitting and pronelying; goal improve soft tissue elasticity, decrease spasms, improve ROM Therapeutic exercise: Patient performed exercises to right LE with assistance, verbal and tactile cuing of therapist:goal; improve ROM, decrease pain, improved functional ambulation Patient sitting at edge of treatment table: with assistance of therapist performed knee flexion extension right knee multiple sets 3-5 reps Patient prone lying; right LE knee flexion performed by patient with assist of therapist with isometric hold at full flexion and mild stretch with increased pain and spasm in anterior aspect of patella/superior to patella limiting further motion. Multiple reps performed Leg press 65# x 20 reps bilateral LE's with assistance to set up exercise, VC for correct technique and alignment Modalities: Electrical stimulation; Russian stim. 10/10 cycle right quadriceps,intensity to tolerance/contraction (62ma); high volt estim. to lateral quadriceps right LE, intensity to tolerance 230 volts x .with patient supinelying with lower leg supported on pillowwith moist heat applied to quadriceps with estim.;Goal: muscle re education, pain control, reduce spasms, improve ROM     Patient response to treatment: patient demonstrated improved technique with exercises with minimal VC for correct alignment. Patient with increased pain from during right knee flexion exercises.  Patient with decreased spasms by 30%  following STM. Improved motor control with repetition and cuing, following estim.       PT Education - 03/03/17 1807    Education provided Yes   Education Details HEP; continue with exercises as instructed, re inforced strengthening for quadriceps and ROM right knee   Person(s) Educated Patient   Methods Explanation   Comprehension Verbalized understanding             PT Long Term  Goals - 02/11/17 1954      PT LONG TERM GOAL #1   Title Patient will improve AAROM  right knee up to 70 degrees by 03/11/2017 to allow him to sit and walk with more normal gait pattern and to allow decreased pain in knee   Baseline AAROM right knee 0-10 degrees flexion with pain; 10/26/16 0-50 degreed flexion; 65 degrees flextion 11/19/16; 12/31/16 60 degrees flexion 02/11/17 0-50    Status Not Met     PT LONG TERM GOAL #2   Title patient will improve LEFS to 40/80 by 03/11/17 to demonstrate improving function with daily activities    Baseline LEFS 0/80; 10/26/16 deferred; 11/19/16 25/80; 02/11/17 30/80   Status Revised     PT LONG TERM GOAL #3   Title patient will have pain level max of 5/10 by 03/11/2017 to allow improved function with moving, sitting and walking   Baseline pain level max 10/10 with aggravating activities; 10/26/16 no pain reported unless moving right leg/knee then increases to 8-10/10   Status On-going     PT LONG TERM GOAL #4   Title patient will be able to walk with least restrictive device safely on level surfaces and stairs by 03/11/2017 to improve function with household and community activities   Baseline walking with rolling walker, WBAT RLE (non weight bearing due to pain); ambulating with axillary crutches, weaning to one crutch 11/19/16; 01/28/17 walking with one crutch and intermittently no crutches   Status Revised     PT LONG TERM GOAL #5   Title patient will be independent with home program for pain control, flexibiltiy, strength for right LE by discharge 03/11/17 in order to continue to improve function with self management   Baseline requires assistance and guidance for all exercises, limited knowledge of appropriate pain control strategies, progression of exercises    Status Revised           Plan - 03/03/17 1808    Clinical Impression Statement Patient is slowly improving with decreasing pain, improving soft tissue elasticyt which allows him to perform exercises with less pain/improved flexiblity. He continues with limited ROM right knee flexion  with pain and spasms limiting further motion. He will benefit from continued physical therapy intervention to adress limitiations and achieve maximal functional return.    Rehab Potential Good   PT Frequency 2x / week   PT Duration 4 weeks   PT Treatment/Interventions Electrical Stimulation;Gait training;Moist Heat;Therapeutic activities;Therapeutic exercise;Balance training;Neuromuscular re-education;Patient/family education;Passive range of motion;Scar mobilization;Manual techniques   PT Next Visit Plan modalities for pain control, muscle spasms, manual soft tissue mobilization, therapeutic exercise for ROM, gait   PT Home Exercise Plan continue with SLR, knee flexion, extension, weight shifting and pain control      Patient will benefit from skilled therapeutic intervention in order to improve the following deficits and impairments:  Decreased strength, Decreased balance, Hypomobility, Impaired flexibility, Pain, Impaired perceived functional ability, Decreased activity tolerance, Decreased endurance, Increased muscle spasms, Difficulty walking, Decreased range of motion  Visit Diagnosis: Stiffness of right knee, not elsewhere classified  Muscle weakness (generalized)  Pain in right leg  Difficulty in walking, not elsewhere classified     Problem List There are no active problems to display for this patient.   Jomarie Longs PT 03/04/2017, 10:16 PM  [facility] [address], [place], [address] Phone: [phone]   Fax:  [phone]  Name: DESMON HITCHNER MRN: [id number] Date of  Birth: 1981/08/13

## 2017-03-09 ENCOUNTER — Ambulatory Visit: Payer: Self-pay | Admitting: Physical Therapy

## 2017-03-09 DIAGNOSIS — M62838 Other muscle spasm: Secondary | ICD-10-CM

## 2017-03-09 DIAGNOSIS — M25661 Stiffness of right knee, not elsewhere classified: Secondary | ICD-10-CM

## 2017-03-09 DIAGNOSIS — M6281 Muscle weakness (generalized): Secondary | ICD-10-CM

## 2017-03-09 DIAGNOSIS — M79604 Pain in right leg: Secondary | ICD-10-CM

## 2017-03-09 DIAGNOSIS — R262 Difficulty in walking, not elsewhere classified: Secondary | ICD-10-CM

## 2017-03-10 NOTE — Therapy (Signed)
[facility] [address] [place], [address] Phone: [phone]   Fax:  [phone]  Physical Therapy Treatment  Patient Details  Name: Peter Stanley MRN: [id number] Date of Birth: Nov 14, 1981 Referring Provider: Maureen Chatters MD  Encounter Date: 03/09/2017      PT End of Session - 03/09/17 1935    Visit Number 46   Number of Visits 52   Date for PT Re-Evaluation 03/11/17   PT Start Time 1845   PT Stop Time 1930   PT Time Calculation (min) 45 min   Activity Tolerance Patient limited by pain;Patient tolerated treatment well   Behavior During Therapy [facility] for tasks assessed/performed      Past Medical History:  Diagnosis Date  . Allergy   . Anxiety     No past surgical history on file.  There were no vitals filed for this visit.      Subjective Assessment - 03/09/17 1846    Subjective Patieint reports he is improving flexibility in right thigh. He feels therapy is helping to improve pain, right knee flexibiltity and strength.   Limitations Sitting;Standing;Walking;House hold activities;Other (comment)   Patient Stated Goals to be able to perform personal care activites and return to normal activites without limitaitons (prior level of function)   Currently in Pain? No/denies        Objective: Gait: ambulating independently with one axillary crutchwith limp on right LE Palpation: soft tissue mobility right LE decreased with moderate spasms lateral aspect right thigh quadriceps and tender with decreased soft tissue mobility in hamstring muscles AAROM: right knee flexion 0-50/60 degrees pre/post treatment  Treatment: Manual therapy: Right knee: Patella mobilization/distraction with glides superior/inferior and medial/lateral x 3 sets STM right quadriceps, patella tendon,hamstring and calf muscles x 10 min.superficial techniques; patient reclined supine, sitting; goal improve soft  tissue elasticity, decrease spasms, improve ROM Therapeutic exercise: Patient performed exercises to right LE with assistance, verbal and tactile cuing of therapist:goal; improve ROM, decrease pain, improved functional ambulation Patient sitting at edge of treatment table: with assistance of therapist performed knee flexion extension right knee multiple sets 3-5 reps Patient supine lying; right LE knee flexion performed by patient with assist of therapist with isometric hold at full flexion and mild stretch with increased pain and spasm in anterior aspect of patella/superior to patella limiting further motion. Multiple reps performed Leg press 65# x 20 reps bilateral LE's with assistance to set up exercise, VC for correct technique and alignment Modalities: Electrical stimulation; Russian stim. 10/10 cycle right quadriceps,intensity to tolerance/contraction (68m); high volt estim. to lateral quadriceps right LE, intensity to tolerance 230 volts x 229m.with patient supinelying with lower leg supported on pillowwith moist heat applied to quadriceps with estim.;Goal: muscle re education, pain control, reduce spasms, improve ROM     Patient response to treatment: Patient demonstrated improved soft tissue elasticity by 50% following STM to right quadriceps around patella with improved ROM with less discomfort reported. Patient improved quadriceps control following estim.         PT Education - 03/09/17 1930    Education provided Yes   Education Details HEP re assessed to improve ROM   Person(s) Educated Patient   Methods Explanation   Comprehension Verbalized understanding             PT Long Term Goals - 02/11/17 1954      PT LONG TERM GOAL #1   Title Patient will improve AAROM right knee up  to 70 degrees by 03/11/2017 to allow him to sit and walk with more normal gait pattern and to allow decreased pain in knee   Baseline AAROM right knee 0-10 degrees flexion with pain; 10/26/16  0-50 degreed flexion; 65 degrees flextion 11/19/16; 12/31/16 60 degrees flexion 02/11/17 0-50    Status Not Met     PT LONG TERM GOAL #2   Title patient will improve LEFS to 40/80 by 03/11/17 to demonstrate improving function with daily activities    Baseline LEFS 0/80; 10/26/16 deferred; 11/19/16 25/80; 02/11/17 30/80   Status Revised     PT LONG TERM GOAL #3   Title patient will have pain level max of 5/10 by 03/11/2017 to allow improved function with moving, sitting and walking   Baseline pain level max 10/10 with aggravating activities; 10/26/16 no pain reported unless moving right leg/knee then increases to 8-10/10   Status On-going     PT LONG TERM GOAL #4   Title patient will be able to walk with least restrictive device safely on level surfaces and stairs by 03/11/2017 to improve function with household and community activities   Baseline walking with rolling walker, WBAT RLE (non weight bearing due to pain); ambulating with axillary crutches, weaning to one crutch 11/19/16; 01/28/17 walking with one crutch and intermittently no crutches   Status Revised     PT LONG TERM GOAL #5   Title patient will be independent with home program for pain control, flexibiltiy, strength for right LE by discharge 03/11/17 in order to continue to improve function with self management   Baseline requires assistance and guidance for all exercises, limited knowledge of appropriate pain control strategies, progression of exercises    Status Revised             Plan - 03/09/17 1930    Clinical Impression Statement Patient continues to demonstrate steady progress with improving soft tissue elasticity and continues with decreased ROM and strength in right LE with increased pain that limits function with ambulation.    Rehab Potential Good   PT Frequency 2x / week   PT Duration 4 weeks   PT Treatment/Interventions Electrical Stimulation;Gait training;Moist Heat;Therapeutic activities;Therapeutic exercise;Balance  training;Neuromuscular re-education;Patient/family education;Passive range of motion;Scar mobilization;Manual techniques   PT Next Visit Plan modalities for pain control, muscle spasms, manual soft tissue mobilization, therapeutic exercise for ROM, gait   PT Home Exercise Plan continue with SLR, knee flexion, extension, weight shifting and pain control      Patient will benefit from skilled therapeutic intervention in order to improve the following deficits and impairments:  Decreased strength, Decreased balance, Hypomobility, Impaired flexibility, Pain, Impaired perceived functional ability, Decreased activity tolerance, Decreased endurance, Increased muscle spasms, Difficulty walking, Decreased range of motion  Visit Diagnosis: Stiffness of right knee, not elsewhere classified  Muscle weakness (generalized)  Pain in right leg  Difficulty in walking, not elsewhere classified  Other muscle spasm     Problem List There are no active problems to display for this patient.   Jomarie Longs PT 03/10/2017, 11:23 PM  [facility] [address] [place], [address] Phone: [phone]   Fax:  [phone]  Name: OAKLYN MANS MRN: [id number] Date of Birth: 26-Apr-1981

## 2017-03-11 ENCOUNTER — Ambulatory Visit: Payer: Self-pay | Admitting: Physical Therapy

## 2017-03-16 ENCOUNTER — Ambulatory Visit: Payer: Self-pay | Attending: Orthopedic Surgery | Admitting: Physical Therapy

## 2017-03-16 DIAGNOSIS — M25661 Stiffness of right knee, not elsewhere classified: Secondary | ICD-10-CM | POA: Insufficient documentation

## 2017-03-16 DIAGNOSIS — M79604 Pain in right leg: Secondary | ICD-10-CM | POA: Insufficient documentation

## 2017-03-16 DIAGNOSIS — R262 Difficulty in walking, not elsewhere classified: Secondary | ICD-10-CM | POA: Insufficient documentation

## 2017-03-16 DIAGNOSIS — M62838 Other muscle spasm: Secondary | ICD-10-CM | POA: Insufficient documentation

## 2017-03-16 DIAGNOSIS — M6281 Muscle weakness (generalized): Secondary | ICD-10-CM | POA: Insufficient documentation

## 2017-03-18 ENCOUNTER — Encounter: Payer: Self-pay | Admitting: Physical Therapy

## 2017-03-18 ENCOUNTER — Ambulatory Visit: Payer: Self-pay | Admitting: Physical Therapy

## 2017-03-18 DIAGNOSIS — R262 Difficulty in walking, not elsewhere classified: Secondary | ICD-10-CM

## 2017-03-18 DIAGNOSIS — M6281 Muscle weakness (generalized): Secondary | ICD-10-CM

## 2017-03-18 DIAGNOSIS — M62838 Other muscle spasm: Secondary | ICD-10-CM

## 2017-03-18 DIAGNOSIS — M25661 Stiffness of right knee, not elsewhere classified: Secondary | ICD-10-CM

## 2017-03-18 DIAGNOSIS — M79604 Pain in right leg: Secondary | ICD-10-CM

## 2017-03-19 NOTE — Therapy (Signed)
[facility] [address], [place], [address] Phone: [phone]   Fax:  [phone]  Physical Therapy Treatment  Patient Details  Name: Peter Stanley MRN: [id number] Date of Birth: [birth year] Referring Provider: Abram Sander MD  Encounter Date: 03/18/2017      PT End of Session - 03/18/17 1753    Visit Number 47   Number of Visits 60   Date for PT Re-Evaluation 04/29/17   PT Start Time 1746   PT Stop Time 1833   PT Time Calculation (min) 47 min   Activity Tolerance Patient limited by pain;Patient tolerated treatment well   Behavior During Therapy [facility] for tasks assessed/performed      Past Medical History:  Diagnosis Date  . Allergy   . Anxiety     History reviewed. No pertinent surgical history.  There were no vitals filed for this visit.      Subjective Assessment - 03/18/17 1750    Subjective Patient reports he is stiff in right knee/muscles and is walking better with putting weight on right LE using one axillary crutch. He is currently having burning into lower leg anterior shin most likely due to cutting grass the other day.    Limitations Sitting;Standing;Walking;House hold activities;Other (comment)   Patient Stated Goals to be able to perform personal care activites and return to normal activites without limitaitons (prior level of function)   Currently in Pain? Yes   Pain Score 8    Pain Location Knee   Pain Orientation Right   Pain Descriptors / Indicators Aching;Tightness   Pain Type Chronic pain   Pain Onset More than a month ago   Pain Frequency Intermittent     Objective: Gait: ambulating independently with one axillary crutchwith mild limp on right LE Palpation: soft tissue mobility right LE decreased with moderate spasms lateral aspect right thigh quadriceps and patella tendon Outcome measure; LEFS 30/80 (80 = no self perceived disability) AROM right knee 0-50/  60 post treatment with pain limiting further motion  Treatment: Manual therapy: Patella mobilization/distraction with glides superior/inferior and medial/lateral x 3 sets STM right quadriceps, lateral quadriceps, patella tendon x 25 min.superficial techniques; patient reclinedlying; goal improve soft tissue elasticity, decrease spasms, improve ROM Modalities: Electrical stimulation; Russian stim. 10/10 cycle right quadriceps,intensity to tolerance/contraction (45ma); high volt estim. to lateral quadriceps right LE, intensity to tolerance ~230 volts x .with patient supinelying with lower leg supported on pillowwith moist heat applied to quadriceps with estim. No adverse reactions noted;Goal: muscle re education, pain control, reduce spasms, improve ROM  Therapeutic exercise: patient performed with guidance, VC and assistance of therapist Leg press 65# bilateral LE's x 15 reps as tolerated through partial ROM 0-50 degrees flexion of right knee  Patient response to treatment: Patient reported decreased pain from 8/10 to 5/10 following treatment. Improved soft tissue elasticity >50% following soft tissue mobilization. Improved motor control of quadriceps with electrical stimulation. Improved ability to stand and walk with decreased pain and burning in right shin at end of session.          PT Education - 03/18/17 1752    Education provided Yes   Education Details HEP SLR, knee flexion/extension, continue with walking PWB right LE as tolerated   Person(s) Educated Patient   Methods Explanation   Comprehension Verbalized understanding             PT Long Term Goals - 03/18/17 1821      PT  LONG TERM GOAL #1   Title Patient will improve AAROM right knee up to 70 degrees by 04/29/2017 to allow him to sit and walk with more normal gait pattern and to allow decreased pain in knee   Baseline AAROM right knee 0-10 degrees flexion with pain; 10/26/16 0-50 degreed flexion; 65  degrees flextion 11/19/16; 12/31/16 60 degrees flexion 02/11/17 0-50; 03/18/17 0-60    Status Revised     PT LONG TERM GOAL #2   Title patient will improve LEFS to 50/80 by 04/29/17 to demonstrate improving function with daily activities    Baseline LEFS 0/80; 10/26/16 deferred; 11/19/16 25/80; 02/11/17 30/80; 03/18/2017 40/80   Status Revised     PT LONG TERM GOAL #3   Title patient will have pain level max of 5/10 by 04/29/2017 to allow improved function with moving, sitting and walking   Baseline pain level max 10/10 with aggravating activities; 10/26/16 no pain reported unless moving right leg/knee then increases to 8-10/10   Status On-going     PT LONG TERM GOAL #4   Title patient will be able to walk without restrictive device safely on level surfaces and stairs by 6/14//2018 to improve function with household and community activities   Baseline walking with rolling walker, WBAT RLE (non weight bearing due to pain); ambulating with axillary crutches, weaning to one crutch 11/19/16; 01/28/17 walking with one crutch and intermittently no crutches   Status Revised     PT LONG TERM GOAL #5   Title patient will be independent with home program for pain control, flexibiltiy, strength for right LE by discharge 04/29/17 in order to continue to improve function with self management   Baseline requires assistance and guidance for all exercises, limited knowledge of appropriate pain control strategies, progression of exercises    Status Revised               Plan - 03/18/17 1820    Clinical Impression Statement Patient continues with limitations of ROM, Patient demonstrates improvement with soft tissuse elasticity and  decreased spasms and pain in right thigh and knee with physical therapy intervention. He continues with limited ROM right knee flexion 0-60 with pain and spasms limiting further motion. He has decreased functional ambulation  and is currently using one axillary crutch as he continues to  heal from right femur fracture. He will require additional physical therapy intervention to achieve goals and achieve maximal function.    Rehab Potential Good   PT Frequency 2x / week   PT Duration 6 weeks   PT Treatment/Interventions Electrical Stimulation;Gait training;Moist Heat;Therapeutic activities;Therapeutic exercise;Balance training;Neuromuscular re-education;Patient/family education;Passive range of motion;Scar mobilization;Manual techniques   PT Next Visit Plan modalities for pain control, muscle spasms, manual soft tissue mobilization, therapeutic exercise for ROM, gait   PT Home Exercise Plan continue with SLR, knee flexion, extension, weight shifting and pain control   Consulted and Agree with Plan of Care Patient      Patient will benefit from skilled therapeutic intervention in order to improve the following deficits and impairments:  Decreased strength, Decreased balance, Hypomobility, Impaired flexibility, Pain, Impaired perceived functional ability, Decreased activity tolerance, Decreased endurance, Increased muscle spasms, Difficulty walking, Decreased range of motion  Visit Diagnosis: Stiffness of right knee, not elsewhere classified - Plan: PT plan of care cert/re-cert  Muscle weakness (generalized) - Plan: PT plan of care cert/re-cert  Pain in right leg - Plan: PT plan of care cert/re-cert  Difficulty in walking, not elsewhere classified - Plan: PT  plan of care cert/re-cert  Other muscle spasm - Plan: PT plan of care cert/re-cert     Problem List There are no active problems to display for this patient.   Beacher May PT 03/19/2017, 7:12 PM  [place] [facility] [address] [place], [address] Phone: [phone]   Fax:  [phone]  Name: Peter Stanley MRN: [id number] Date of Birth: [birth year]

## 2017-03-22 ENCOUNTER — Ambulatory Visit: Payer: Self-pay | Admitting: Physical Therapy

## 2017-03-22 ENCOUNTER — Encounter: Payer: Self-pay | Admitting: Physical Therapy

## 2017-03-22 DIAGNOSIS — M25661 Stiffness of right knee, not elsewhere classified: Secondary | ICD-10-CM

## 2017-03-22 DIAGNOSIS — M6281 Muscle weakness (generalized): Secondary | ICD-10-CM

## 2017-03-22 DIAGNOSIS — M62838 Other muscle spasm: Secondary | ICD-10-CM

## 2017-03-22 DIAGNOSIS — R262 Difficulty in walking, not elsewhere classified: Secondary | ICD-10-CM

## 2017-03-22 DIAGNOSIS — M79604 Pain in right leg: Secondary | ICD-10-CM

## 2017-03-23 NOTE — Therapy (Signed)
[facility] [place] [facility] [address] [place], [place], [address] Phone: [phone]   Fax:  [phone]  Physical Therapy Treatment  Patient Details  Name: Peter BrunsMatthew T Stanley MRN: [id number] Date of Birth: 1981/04/15 Referring Provider: Abram Sanderpatterson, jennifer MD  Encounter Date: 03/22/2017      PT End of Session - 03/22/17 1818    Visit Number 48   Number of Visits 60   Date for PT Re-Evaluation 04/29/17   PT Start Time 1731   PT Stop Time 1800   PT Time Calculation (min) 29 min   Activity Tolerance Patient limited by pain;Patient tolerated treatment well   [facility] for [facility] assessed/performed      Past Medical History:  Diagnosis Date  . Allergy   . Anxiety     History reviewed. No pertinent surgical history.  There were no vitals filed for this visit.      Subjective Assessment - 03/22/17 1731    Subjective Patient reports having right hip pain today with insidious onset today.    Limitations Sitting;Standing;Walking;House hold activities;Other (comment)   Patient Stated Goals to be able to perform personal care activites and return to normal activites without limitaitons (prior level of function)   Currently in Pain? Yes   Pain Score 6    Pain Location Hip   Pain Orientation Right   Pain Descriptors / Indicators Aching;Nagging   Pain Type Chronic pain   Pain Onset More than a month ago   Pain Frequency Intermittent      Objective: Gait: ambulating independently with one axillary crutchwith mild limp on right LE Palpation: soft tissue mobility right LE decreased with moderate spasms lateral aspect right thigh quadriceps and patella tendon  Treatment: Modalities: Electrical stimulation; Russian stim. 10/10 cycle right quadriceps,intensity to tolerance/contraction (45ma); high volt estim. to lateral quadriceps right LE, intensity to tolerance ~230 volts x 10min.with patient supinelying  with lower leg supported on pillowNo adverse reactions noted;Goal: muscle re education, pain control, reduce spasms, improve ROM  Patient response to treatment: reported decreased hip and right LE pain with positioning supine with bolster under knees. no adverse reactions noted, no reported changes in pain due to patient having to stop treatment and leave unexpectedly.         PT Education - 03/22/17 1734    Education provided Yes   Education Details re assessed home program    Person(s) Educated Patient   Methods Explanation   Comprehension Verbalized understanding             PT Long Term Goals - 03/18/17 1821      PT LONG TERM GOAL #1   Title Patient will improve AAROM right knee up to 70 degrees by 04/29/2017 to allow him to sit and walk with more normal gait pattern and to allow decreased pain in knee   Baseline AAROM right knee 0-10 degrees flexion with pain; 10/26/16 0-50 degreed flexion; 65 degrees flextion 11/19/16; 12/31/16 60 degrees flexion 02/11/17 0-50; 03/18/17 0-60    Status Revised     PT LONG TERM GOAL #2   Title patient will improve LEFS to 50/80 by 04/29/17 to demonstrate improving function with daily activities    Baseline LEFS 0/80; 10/26/16 deferred; 11/19/16 25/80; 02/11/17 30/80; 03/18/2017 40/80   Status Revised     PT LONG TERM GOAL #3   Title patient will have pain level max of 5/10 by 04/29/2017 to allow improved function with moving, sitting  and walking   Baseline pain level max 10/10 with aggravating activities; 10/26/16 no pain reported unless moving right leg/knee then increases to 8-10/10   Status On-going     PT LONG TERM GOAL #4   Title patient will be able to walk without restrictive device safely on level surfaces and stairs by 6/14//2018 to improve function with household and community activities   Baseline walking with rolling walker, WBAT RLE (non weight bearing due to pain); ambulating with axillary crutches, weaning to one crutch 11/19/16;  01/28/17 walking with one crutch and intermittently no crutches   Status Revised     PT LONG TERM GOAL #5   Title patient will be independent with home program for pain control, flexibiltiy, strength for right LE by discharge 04/29/17 in order to continue to improve function with self management   Baseline requires assistance and guidance for all exercises, limited knowledge of appropriate pain control strategies, progression of exercises    Status Revised               Plan - 03/22/17 1800    Clinical Impression Statement Patient treatment session limited due to patient having to leave early/unexpectedly because of family situation. He continues with limited ROM, increased pain and limited function with walking and daily tasks due to healing of rith LE femur fracture and will require continued physical therapy intervention to achieve goals.   Rehab Potential Good   PT Frequency 2x / week   PT Duration 6 weeks   PT Treatment/Interventions Electrical Stimulation;Gait training;Moist Heat;Therapeutic activities;Therapeutic exercise;Balance training;Neuromuscular re-education;Patient/family education;Passive range of motion;Scar mobilization;Manual techniques   PT Next Visit Plan modalities for pain control, muscle spasms, manual soft tissue mobilization, therapeutic exercise for ROM, gait   PT Home Exercise Plan continue with SLR, knee flexion, extension, weight shifting and pain control   Consulted and Agree with Plan of Care Patient      Patient will benefit from skilled therapeutic intervention in order to improve the following deficits and impairments:  Decreased strength, Decreased balance, Hypomobility, Impaired flexibility, Pain, Impaired perceived functional ability, Decreased activity tolerance, Decreased endurance, Increased muscle spasms, Difficulty walking, Decreased range of motion  Visit Diagnosis: Stiffness of right knee, not elsewhere classified  Muscle weakness  (generalized)  Pain in right leg  Difficulty in walking, not elsewhere classified  Other muscle spasm     Problem List There are no active problems to display for this patient.   Beacher May PT 03/23/2017, 3:19 PM  [place] [facility] [address] [place], [address] Phone: [phone]   Fax:  [phone]  Name: Peter Stanley MRN: [id number] Date of Birth: 1981-08-13

## 2017-03-24 ENCOUNTER — Encounter: Admitting: Physical Therapy

## 2017-03-30 ENCOUNTER — Encounter: Payer: Self-pay | Admitting: Physical Therapy

## 2017-03-30 ENCOUNTER — Ambulatory Visit: Payer: Self-pay | Admitting: Physical Therapy

## 2017-03-30 DIAGNOSIS — M6281 Muscle weakness (generalized): Secondary | ICD-10-CM

## 2017-03-30 DIAGNOSIS — M79604 Pain in right leg: Secondary | ICD-10-CM

## 2017-03-30 DIAGNOSIS — M62838 Other muscle spasm: Secondary | ICD-10-CM

## 2017-03-30 DIAGNOSIS — R262 Difficulty in walking, not elsewhere classified: Secondary | ICD-10-CM

## 2017-03-30 DIAGNOSIS — M25661 Stiffness of right knee, not elsewhere classified: Secondary | ICD-10-CM

## 2017-03-31 NOTE — Therapy (Signed)
[facility] [address], [place], [address] Phone: [phone]   Fax:  [phone]  Physical Therapy Treatment  Patient Details  Name: Peter Stanley MRN: [id number] Date of Birth: 1981/08/08 Referring Provider: Abram Sander MD  Encounter Date: 03/30/2017      PT End of Session - 03/30/17 1840    Visit Number 49   Number of Visits 60   Date for PT Re-Evaluation 04/29/17   PT Start Time 1800   PT Stop Time 1840   PT Time Calculation (min) 40 min   Activity Tolerance Patient limited by pain;Patient tolerated treatment well   Behavior During Therapy [facility] for tasks assessed/performed      Past Medical History:  Diagnosis Date  . Allergy   . Anxiety     History reviewed. No pertinent surgical history.  There were no vitals filed for this visit.      Subjective Assessment - 03/30/17 1808    Subjective Patient reports he is having a good day today. He is still having pain and stiffness in right knee.    Limitations Sitting;Standing;Walking;House hold activities;Other (comment)   Patient Stated Goals to be able to perform personal care activites and return to normal activites without limitaitons (prior level of function)   Currently in Pain? Yes   Pain Score 4    Pain Location Leg   Pain Orientation Right   Pain Descriptors / Indicators Aching;Tightness   Pain Type Chronic pain   Pain Onset More than a month ago   Pain Frequency Intermittent      Objective: Gait: ambulating independently with one axillary crutchwith mild limp on right LE Palpation: soft tissue mobility right LE decreased with moderate spasms anterior aspect right thigh quadriceps and patella tendon AAROM measured in sitting at edge of treatment table: right knee 0-50/ 60 post treatment with pain limiting further motion  Treatment: Patient performed 5 min. Warm up on Nustep for ROM, endurance prior to treatment:  unbilled time Manual therapy: Patella mobilization/distraction with glides superior/inferior and medial/lateral x 3 sets STM right quadriceps, patella tendon x 13 min.superficial techniques; joint mobilization tibia on femur posterior glides grade 2-3 multiple repetitions followed by ROM into flexion with patient reclinedlying; goal improve soft tissue elasticity, decrease spasms, improve ROM  Modalities: Electrical stimulation; Russian stim. 10/10 cycle right quadriceps,intensity to tolerance/contraction (45ma) x .with patient supinelying with lower leg supported on pillowwith moist heat applied to quadriceps with estim. No adverse reactions noted;Goal: muscle re education, pain control, reduce spasms, improve ROM  Patient response to treatment: Patient demonstrated improved soft tissue and joint mobility following manual therapy techniques. Patient limited by pain with attempts to perform right knee flexion; difficult to determine end feel due to pain/spasms. Patient with improved motor control of quadriceps following estim.          PT Education - 03/30/17 1830    Education provided Yes   Education Details ROM exercises, prolonged stretch with light intensity   Person(s) Educated Patient   Methods Explanation;Demonstration   Comprehension Verbalized understanding;Returned demonstration             PT Long Term Goals - 03/18/17 1821      PT LONG TERM GOAL #1   Title Patient will improve AAROM right knee up to 70 degrees by 04/29/2017 to allow him to sit and walk with more normal gait pattern and to allow decreased pain in knee   Baseline AAROM right knee  0-10 degrees flexion with pain; 10/26/16 0-50 degreed flexion; 65 degrees flextion 11/19/16; 12/31/16 60 degrees flexion 02/11/17 0-50; 03/18/17 0-60    Status Revised     PT LONG TERM GOAL #2   Title patient will improve LEFS to 50/80 by 04/29/17 to demonstrate improving function with daily activities    Baseline LEFS  0/80; 10/26/16 deferred; 11/19/16 25/80; 02/11/17 30/80; 03/18/2017 40/80   Status Revised     PT LONG TERM GOAL #3   Title patient will have pain level max of 5/10 by 04/29/2017 to allow improved function with moving, sitting and walking   Baseline pain level max 10/10 with aggravating activities; 10/26/16 no pain reported unless moving right leg/knee then increases to 8-10/10   Status On-going     PT LONG TERM GOAL #4   Title patient will be able to walk without restrictive device safely on level surfaces and stairs by 6/14//2018 to improve function with household and community activities   Baseline walking with rolling walker, WBAT RLE (non weight bearing due to pain); ambulating with axillary crutches, weaning to one crutch 11/19/16; 01/28/17 walking with one crutch and intermittently no crutches   Status Revised     PT LONG TERM GOAL #5   Title patient will be independent with home program for pain control, flexibiltiy, strength for right LE by discharge 04/29/17 in order to continue to improve function with self management   Baseline requires assistance and guidance for all exercises, limited knowledge of appropriate pain control strategies, progression of exercises    Status Revised               Plan - 03/30/17 1840    Clinical Impression Statement Patient demonstrated improved flexibility in right knee, soft tissue in thigh. He continues with pain as primary limiting factor to being able to flex right knee >60 degrees. He is responding favorably to current treatment with good carry over between sessions and he is still healing from femur fracture wihich limits full function. He shoud continue to improve with additional physical therapy intervention.    Rehab Potential Good   PT Frequency 2x / week   PT Duration 6 weeks   PT Treatment/Interventions Electrical Stimulation;Gait training;Moist Heat;Therapeutic activities;Therapeutic exercise;Balance training;Neuromuscular  re-education;Patient/family education;Passive range of motion;Scar mobilization;Manual techniques   PT Next Visit Plan modalities for pain control, muscle spasms, manual soft tissue mobilization, therapeutic exercise for ROM, gait   PT Home Exercise Plan continue with SLR, knee flexion, extension, weight shifting and pain control      Patient will benefit from skilled therapeutic intervention in order to improve the following deficits and impairments:  Decreased strength, Decreased balance, Hypomobility, Impaired flexibility, Pain, Impaired perceived functional ability, Decreased activity tolerance, Decreased endurance, Increased muscle spasms, Difficulty walking, Decreased range of motion  Visit Diagnosis: Stiffness of right knee, not elsewhere classified  Muscle weakness (generalized)  Pain in right leg  Difficulty in walking, not elsewhere classified  Other muscle spasm     Problem List There are no active problems to display for this patient.   Beacher MayBrooks, Shaneequa Bahner PT 03/31/2017, 6:43 PM  [place] [facility] [address] [place], [place], [address] Phone: [phone]   Fax:  [phone]  Name: Peter BrunsMatthew T Stanley MRN: [id number] Date of Birth: 1981/07/02

## 2017-04-06 ENCOUNTER — Encounter: Payer: Self-pay | Admitting: Physical Therapy

## 2017-04-06 ENCOUNTER — Ambulatory Visit: Payer: Self-pay | Admitting: Physical Therapy

## 2017-04-06 DIAGNOSIS — R262 Difficulty in walking, not elsewhere classified: Secondary | ICD-10-CM

## 2017-04-06 DIAGNOSIS — M62838 Other muscle spasm: Secondary | ICD-10-CM

## 2017-04-06 DIAGNOSIS — M6281 Muscle weakness (generalized): Secondary | ICD-10-CM

## 2017-04-06 DIAGNOSIS — M79604 Pain in right leg: Secondary | ICD-10-CM

## 2017-04-06 DIAGNOSIS — M25661 Stiffness of right knee, not elsewhere classified: Secondary | ICD-10-CM

## 2017-04-07 NOTE — Therapy (Signed)
[place] [facility] PH[facility] SPOR[facility] MEDICINE [address] [place], [place], [address] Phone: [phone]   Fax:  [phone]  Physical Therapy Treatment  Patient Details  Name: Fabienne BrunsMatthew T Cedillo MRN: [id number] Date of Birth: [birth year] Referring Provider: Abram Sanderpatterson, jennifer MD  Encounter Date: 04/06/2017      PT End of Session - 04/06/17 1844    Visit Number 50   Number of Visits 60   Date for PT Re-Evaluation 04/29/17   PT Start Time 1748   PT Stop Time 1833   PT Time Calculation (min) 45 min   Activity Tolerance Patient limited by pain;Patient tolerated treatment well   Behavior During Therapy [facility] for tasks assessed/performed      Past Medical History:  Diagnosis Date  . Allergy   . Anxiety     History reviewed. No pertinent surgical history.  There were no vitals filed for this visit.      Subjective Assessment - 04/06/17 1750    Subjective Patient reports he is having a good day today. He is still having pain and stiffness in right knee.    Limitations Sitting;Standing;Walking;House hold activities;Other (comment)   Patient Stated Goals to be able to perform personal care activites and return to normal activites without limitaitons (prior level of function)   Currently in Pain? Yes   Pain Score 7    Pain Location Leg   Pain Orientation Right   Pain Descriptors / Indicators Aching;Tightness   Pain Type Chronic pain   Pain Onset More than a month ago   Pain Frequency Intermittent      Objective: Gait: ambulating independently with one axillary crutchwith mildlimp on right LE Palpation: soft tissue mobility right LE decreased with moderate spasms anterior aspect right thigh quadriceps and patella tendon AAROM: right knee flexion to 55/60 pre/post treatment with pain, stiffness limiting further motion  Treatment: Patient performed leg press 55, 65# press with both LE's 2 x 15 reps with increased right leg pain, prior to  STM and modalities Manual therapy: Patella mobilization/distraction with glides superior/inferior and medial/lateral x 3 sets STM right quadriceps, central and lateral aspect, patella tendonx 15 min.superficial techniques; joint mobilization tibia on femur posterior glides grade 2-3 multiple repetitions followed by ROM into flexion with patient reclinedlying; goal improve soft tissue elasticity, decrease spasms, improve ROM  Modalities: Electrical stimulation; Russian stim. 10/10 cycle right quadriceps (45ma),  and high volt (2) electrodes applied to quadriceps superior to patella and lateral thigh, intensity to tolerance/contraction x 20min.with patient supinelying with lower leg supported on pillow;Goal: muscle re education, pain control, reduce spasms, improve ROM  Patient response to treatment: Patient demonstrated improved soft tissue elasticity 50% following STM. Improved quadriceps control, contraction with estim. decreased pain by 30% following treatment.          PT Education - 04/06/17 1757    Education provided Yes   Education Details continue with exercises for ROM   Person(s) Educated Patient   Methods Explanation   Comprehension Verbalized understanding             PT Long Term Goals - 03/18/17 1821      PT LONG TERM GOAL #1   Title Patient will improve AAROM right knee up to 70 degrees by 04/29/2017 to allow him to sit and walk with more normal gait pattern and to allow decreased pain in knee   Baseline AAROM right knee 0-10 degrees flexion with pain; 10/26/16 0-50 degreed flexion; 65 degrees flextion 11/19/16;  12/31/16 60 degrees flexion 02/11/17 0-50; 03/18/17 0-60    Status Revised     PT LONG TERM GOAL #2   Title patient will improve LEFS to 50/80 by 04/29/17 to demonstrate improving function with daily activities    Baseline LEFS 0/80; 10/26/16 deferred; 11/19/16 25/80; 02/11/17 30/80; 03/18/2017 40/80   Status Revised     PT LONG TERM GOAL #3   Title patient  will have pain level max of 5/10 by 04/29/2017 to allow improved function with moving, sitting and walking   Baseline pain level max 10/10 with aggravating activities; 10/26/16 no pain reported unless moving right leg/knee then increases to 8-10/10   Status On-going     PT LONG TERM GOAL #4   Title patient will be able to walk without restrictive device safely on level surfaces and stairs by 6/14//2018 to improve function with household and community activities   Baseline walking with rolling walker, WBAT RLE (non weight bearing due to pain); ambulating with axillary crutches, weaning to one crutch 11/19/16; 01/28/17 walking with one crutch and intermittently no crutches   Status Revised     PT LONG TERM GOAL #5   Title patient will be independent with home program for pain control, flexibiltiy, strength for right LE by discharge 04/29/17 in order to continue to improve function with self management   Baseline requires assistance and guidance for all exercises, limited knowledge of appropriate pain control strategies, progression of exercises    Status Revised               Plan - 04/06/17 1845    Clinical Impression Statement Patient continues with pain in right thigh and weakness that limits abiltiy to walk and perform exercises without difficulty AAROM righ tknee flexion up to 60 degrees with stiffness/pain limitng furtehr motion   Rehab Potential Good   PT Frequency 2x / week   PT Duration 6 weeks   PT Treatment/Interventions Electrical Stimulation;Gait training;Moist Heat;Therapeutic activities;Therapeutic exercise;Balance training;Neuromuscular re-education;Patient/family education;Passive range of motion;Scar mobilization;Manual techniques   PT Next Visit Plan modalities for pain control, muscle spasms, manual soft tissue mobilization, therapeutic exercise for ROM, gait   PT Home Exercise Plan continue with SLR, knee flexion, extension, weight shifting and pain control       Patient will benefit from skilled therapeutic intervention in order to improve the following deficits and impairments:  Decreased strength, Decreased balance, Hypomobility, Impaired flexibility, Pain, Impaired perceived functional ability, Decreased activity tolerance, Decreased endurance, Increased muscle spasms, Difficulty walking, Decreased range of motion  Visit Diagnosis: Stiffness of right knee, not elsewhere classified  Muscle weakness (generalized)  Pain in right leg  Difficulty in walking, not elsewhere classified  Other muscle spasm     Problem List There are no active problems to display for this patient.   Beacher May PT 04/07/2017, 7:14 PM  [facility] PHYSICAL AND SPORTS MEDICINE [address], [place], [address] Phone: [phone]   Fax:  [phone]  Name: DAKARAI MCGLOCKLIN MRN: [id number] Date of Birth: May 13, 1981

## 2017-04-08 ENCOUNTER — Ambulatory Visit: Payer: Self-pay | Admitting: Physical Therapy

## 2017-04-08 DIAGNOSIS — M6281 Muscle weakness (generalized): Secondary | ICD-10-CM

## 2017-04-08 DIAGNOSIS — M25661 Stiffness of right knee, not elsewhere classified: Secondary | ICD-10-CM

## 2017-04-08 DIAGNOSIS — M62838 Other muscle spasm: Secondary | ICD-10-CM

## 2017-04-08 DIAGNOSIS — M79604 Pain in right leg: Secondary | ICD-10-CM

## 2017-04-08 DIAGNOSIS — R262 Difficulty in walking, not elsewhere classified: Secondary | ICD-10-CM

## 2017-04-09 NOTE — Therapy (Signed)
[facility] [address] [place], [place], [address] Phone: [phone]   Fax:  [phone]  Physical Therapy Treatment  Patient Details  Name: Peter Stanley MRN: [id number] Date of Birth: 04/30/1981 Referring Provider: Abram Sanderpatterson, jennifer MD  Encounter Date: 04/08/2017      PT End of Session - 04/08/17 1939    Visit Number 51   Number of Visits 60   Date for PT Re-Evaluation 04/29/17   PT Start Time 1749   PT Stop Time 1830   PT Time Calculation (min) 41 min   Activity Tolerance Patient limited by pain;Patient tolerated treatment well   Behavior During Therapy [facility] for tasks assessed/performed      Past Medical History:  Diagnosis Date  . Allergy   . Anxiety     No past surgical history on file.  There were no vitals filed for this visit.      Subjective Assessment - 04/08/17 1750    Subjective Patient reports he is now walking without AD and with brace on right knee. He feels hi knee is bending more.    Limitations Sitting;Standing;Walking;House hold activities;Other (comment)   Patient Stated Goals to be able to perform personal care activites and return to normal activites without limitaitons (prior level of function)   Currently in Pain? No/denies   Pain Onset --      Objective: Gait: ambulating independently without AD and wearing brace on right knee Palpation: soft tissue mobility right LE decreased with moderate spasms anterior aspect right thigh quadriceps and patella tendon AAROM: right knee flexion to 55/60 pre/post treatment with pain, stiffness limiting further motion  Treatment: Manual therapy: Patella mobilization/distraction with glides superior/inferior and medial/lateral x 3 sets STM right quadriceps, central and lateral aspect, patella tendonx 25min.superficial techniques; joint mobilization tibia on femur posterior glides grade 2-3 multiple repetitions followed by ROM  into flexion with patient reclinedlying; goal improve soft tissue elasticity, decrease spasms, improve ROM  Therapeutic exercise: goal: improve knee flexion, independent with home program; patient performed with assistance, VC of therapist AAROM right knee into flexion multiple reps/sets in conjunction with manual techniques AAROM right knee flexion/extension over 45cm ball on treatment table       Patient response to treatment: Improved soft tissue elasticity >50% with reported decreased pain with knee flexion exercises.         PT Education - 04/08/17 1832    Education provided Yes   Education Details stretching right LE knee flexion over a ball   Person(s) Educated Patient   Methods Explanation;Demonstration   Comprehension Verbalized understanding;Returned demonstration             PT Long Term Goals - 03/18/17 1821      PT LONG TERM GOAL #1   Title Patient will improve AAROM right knee up to 70 degrees by 04/29/2017 to allow him to sit and walk with more normal gait pattern and to allow decreased pain in knee   Baseline AAROM right knee 0-10 degrees flexion with pain; 10/26/16 0-50 degreed flexion; 65 degrees flextion 11/19/16; 12/31/16 60 degrees flexion 02/11/17 0-50; 03/18/17 0-60    Status Revised     PT LONG TERM GOAL #2   Title patient will improve LEFS to 50/80 by 04/29/17 to demonstrate improving function with daily activities    Baseline LEFS 0/80; 10/26/16 deferred; 11/19/16 25/80; 02/11/17 30/80; 03/18/2017 40/80   Status Revised     PT LONG TERM GOAL #3  Title patient will have pain level max of 5/10 by 04/29/2017 to allow improved function with moving, sitting and walking   Baseline pain level max 10/10 with aggravating activities; 10/26/16 no pain reported unless moving right leg/knee then increases to 8-10/10   Status On-going     PT LONG TERM GOAL #4   Title patient will be able to walk without restrictive device safely on level surfaces and stairs by  6/14//2018 to improve function with household and community activities   Baseline walking with rolling walker, WBAT RLE (non weight bearing due to pain); ambulating with axillary crutches, weaning to one crutch 11/19/16; 01/28/17 walking with one crutch and intermittently no crutches   Status Revised     PT LONG TERM GOAL #5   Title patient will be independent with home program for pain control, flexibiltiy, strength for right LE by discharge 04/29/17 in order to continue to improve function with self management   Baseline requires assistance and guidance for all exercises, limited knowledge of appropriate pain control strategies, progression of exercises    Status Revised               Plan - 04/08/17 1830    Clinical Impression Statement improving ability to tolerate increased stretching of right knee into flexion. continues with stiffness and limited flexion to 60/65 degrees. ambulating without AD with brace on right knee.    Rehab Potential Good   PT Frequency 2x / week   PT Duration 6 weeks   PT Treatment/Interventions Electrical Stimulation;Gait training;Moist Heat;Therapeutic activities;Therapeutic exercise;Balance training;Neuromuscular re-education;Patient/family education;Passive range of motion;Scar mobilization;Manual techniques   PT Next Visit Plan modalities for pain control, muscle spasms, manual soft tissue mobilization, therapeutic exercise for ROM, gait   PT Home Exercise Plan continue with SLR, knee flexion, extension, weight shifting and pain control      Patient will benefit from skilled therapeutic intervention in order to improve the following deficits and impairments:  Decreased strength, Decreased balance, Hypomobility, Impaired flexibility, Pain, Impaired perceived functional ability, Decreased activity tolerance, Decreased endurance, Increased muscle spasms, Difficulty walking, Decreased range of motion  Visit Diagnosis: Stiffness of right knee, not elsewhere  classified  Muscle weakness (generalized)  Pain in right leg  Difficulty in walking, not elsewhere classified  Other muscle spasm     Problem List There are no active problems to display for this patient.   Beacher May PT 04/09/2017, 11:14 PM  [place] [facility] PHYSICAL AND SPORTS MEDICINE [address], [place], [address] Phone: [phone]   Fax:  [phone]  Name: Peter Stanley MRN: [id number] Date of Birth: 1981/08/15

## 2017-04-13 ENCOUNTER — Encounter: Payer: Self-pay | Admitting: Physical Therapy

## 2017-04-13 ENCOUNTER — Ambulatory Visit: Payer: Self-pay | Admitting: Physical Therapy

## 2017-04-13 DIAGNOSIS — M6281 Muscle weakness (generalized): Secondary | ICD-10-CM

## 2017-04-13 DIAGNOSIS — M62838 Other muscle spasm: Secondary | ICD-10-CM

## 2017-04-13 DIAGNOSIS — M25661 Stiffness of right knee, not elsewhere classified: Secondary | ICD-10-CM

## 2017-04-13 DIAGNOSIS — R262 Difficulty in walking, not elsewhere classified: Secondary | ICD-10-CM

## 2017-04-13 DIAGNOSIS — M79604 Pain in right leg: Secondary | ICD-10-CM

## 2017-04-13 NOTE — Therapy (Signed)
[place] [facility] [address], [place], [address] Phone: [phone]   Fax:  [phone]  Physical Therapy Treatment  Patient Details  Name: Peter Stanley MRN: [id number] Date of Birth: [birth year] Referring Provider: Abram Sander MD  Encounter Date: 04/13/2017      PT End of Session - 04/13/17 1802    Visit Number 52   Number of Visits 60   Date for PT Re-Evaluation 04/29/17   PT Start Time 1730   PT Stop Time 1803   PT Time Calculation (min) 33 min   Activity Tolerance Patient limited by pain;Patient tolerated treatment well   Behavior During Therapy [facility] for tasks assessed/performed      Past Medical History:  Diagnosis Date  . Allergy   . Anxiety     History reviewed. No pertinent surgical history.  There were no vitals filed for this visit.      Subjective Assessment - 04/13/17 1733    Subjective Patient reports he is now walking without AD and with brace on right knee. He feels hi knee is bending more.    Limitations Sitting;Standing;Walking;House hold activities;Other (comment)   Patient Stated Goals to be able to perform personal care activites and return to normal activites without limitaitons (prior level of function)   Currently in Pain? No/denies        Objective: Gait: ambulating independently without AD and wearing brace on right knee Palpation: soft tissue mobility right LE decreased with moderate spasms anterior aspect right thigh quadriceps and patella tendon AAROM: right knee flexion to 60/67 pre/post treatment with pain, stiffness limiting further motion  Treatment: Manual therapy: Patella mobilization/distraction with glides superior/inferior and medial/lateral x 3 sets STM right quadriceps, central and lateral aspect, patella tendonx .superficial techniques; joint mobilization tibia on femur posterior glides grade 2-3 multiple repetitions followed by ROM  into flexion with patient reclinedlying; goal improve soft tissue elasticity, decrease spasms, improve ROM  Therapeutic exercise: goal: improve knee flexion, independent with home program; patient performed with assistance, VC of therapist AAROM right knee into flexion multiple reps/sets in conjunction with manual techniques Knee flexion over edge of treatment table with 5# weight on ankle with prolonged stretch with constant monitoring for pain, correct alignment x 10 min. With patient reporting soreness and improved flexibility into flexion  Patient response to treatment: patient demonstrated improved technique with exercises with minimal VC for correct LE/knee alignment. Patient with decreased spasms and improved soft tissue elasticity by >50% following STM. Increased soreness in right patella tendon with STM and with prolonged stretch on right knee in flexion, no worse following session.           PT Education - 04/13/17 1801    Education provided Yes   Education Details LLLD stretching with knee flexed, weight on ankle . 2x/day    Person(s) Educated Patient   Methods Explanation;Demonstration;Verbal cues   Comprehension Verbalized understanding;Returned demonstration;Verbal cues required             PT Long Term Goals - 03/18/17 1821      PT LONG TERM GOAL #1   Title Patient will improve AAROM right knee up to 70 degrees by 04/29/2017 to allow him to sit and walk with more normal gait pattern and to allow decreased pain in knee   Baseline AAROM right knee 0-10 degrees flexion with pain; 10/26/16 0-50 degreed flexion; 65 degrees flextion 11/19/16; 12/31/16 60 degrees flexion 02/11/17 0-50; 03/18/17 0-60  Status Revised     PT LONG TERM GOAL #2   Title patient will improve LEFS to 50/80 by 04/29/17 to demonstrate improving function with daily activities    Baseline LEFS 0/80; 10/26/16 deferred; 11/19/16 25/80; 02/11/17 30/80; 03/18/2017 40/80   Status Revised     PT LONG TERM  GOAL #3   Title patient will have pain level max of 5/10 by 04/29/2017 to allow improved function with moving, sitting and walking   Baseline pain level max 10/10 with aggravating activities; 10/26/16 no pain reported unless moving right leg/knee then increases to 8-10/10   Status On-going     PT LONG TERM GOAL #4   Title patient will be able to walk without restrictive device safely on level surfaces and stairs by 6/14//2018 to improve function with household and community activities   Baseline walking with rolling walker, WBAT RLE (non weight bearing due to pain); ambulating with axillary crutches, weaning to one crutch 11/19/16; 01/28/17 walking with one crutch and intermittently no crutches   Status Revised     PT LONG TERM GOAL #5   Title patient will be independent with home program for pain control, flexibiltiy, strength for right LE by discharge 04/29/17 in order to continue to improve function with self management   Baseline requires assistance and guidance for all exercises, limited knowledge of appropriate pain control strategies, progression of exercises    Status Revised               Plan - 04/13/17 1805    Clinical Impression Statement improved ROM to 67 degrees flexion with LLLD with 5# weight. Pain and stiffness are primary limitations to full ROM and abiltiy to walk without difficulty and perform daily chores/tasks. He will continue to benefit from physical therapy intervetnion to address limitations and progress towards maximal functional return.    Rehab Potential Good   PT Frequency 2x / week   PT Duration 6 weeks   PT Treatment/Interventions Electrical Stimulation;Gait training;Moist Heat;Therapeutic activities;Therapeutic exercise;Balance training;Neuromuscular re-education;Patient/family education;Passive range of motion;Scar mobilization;Manual techniques   PT Next Visit Plan modalities for pain control, muscle spasms, manual soft tissue mobilization, therapeutic  exercise for ROM, gait   PT Home Exercise Plan continue with SLR, knee flexion, extension, weight shifting and pain control      Patient will benefit from skilled therapeutic intervention in order to improve the following deficits and impairments:  Decreased strength, Decreased balance, Hypomobility, Impaired flexibility, Pain, Impaired perceived functional ability, Decreased activity tolerance, Decreased endurance, Increased muscle spasms, Difficulty walking, Decreased range of motion  Visit Diagnosis: Stiffness of right knee, not elsewhere classified  Muscle weakness (generalized)  Pain in right leg  Difficulty in walking, not elsewhere classified  Other muscle spasm     Problem List There are no active problems to display for this patient.   Beacher MayBrooks, Marie PT 04/14/2017, 7:27 PM  [facility] [address] [place], [place], [address] Phone: [phone]   Fax:  [phone]  Name: Peter Stanley MRN: [id number] Date of Birth: 18-Oct-1981

## 2017-04-15 ENCOUNTER — Ambulatory Visit: Payer: Self-pay | Admitting: Physical Therapy

## 2017-04-19 ENCOUNTER — Ambulatory Visit: Payer: Self-pay | Attending: Orthopedic Surgery | Admitting: Physical Therapy

## 2017-04-19 ENCOUNTER — Encounter: Payer: Self-pay | Admitting: Physical Therapy

## 2017-04-19 DIAGNOSIS — M62838 Other muscle spasm: Secondary | ICD-10-CM | POA: Insufficient documentation

## 2017-04-19 DIAGNOSIS — M6281 Muscle weakness (generalized): Secondary | ICD-10-CM | POA: Insufficient documentation

## 2017-04-19 DIAGNOSIS — R262 Difficulty in walking, not elsewhere classified: Secondary | ICD-10-CM | POA: Insufficient documentation

## 2017-04-19 DIAGNOSIS — M79604 Pain in right leg: Secondary | ICD-10-CM | POA: Insufficient documentation

## 2017-04-19 DIAGNOSIS — M25661 Stiffness of right knee, not elsewhere classified: Secondary | ICD-10-CM | POA: Insufficient documentation

## 2017-04-19 NOTE — Therapy (Signed)
[facility] [address] [place], [address] Phone: [phone]   Fax:  [phone]  Physical Therapy Treatment  Patient Details  Name: Peter Stanley MRN: [id number] Date of Birth: 16-May-1981 Referring Provider: Abram Sander MD  Encounter Date: 04/19/2017      PT End of Session - 04/19/17 1733    Visit Number 53   Number of Visits 60   Date for PT Re-Evaluation 04/29/17   PT Start Time 1636   PT Stop Time 1716   PT Time Calculation (min) 40 min   Activity Tolerance Patient limited by pain;Patient tolerated treatment well   Behavior During Therapy [facility] for tasks assessed/performed      Past Medical History:  Diagnosis Date  . Allergy   . Anxiety     History reviewed. No pertinent surgical history.  There were no vitals filed for this visit.      Subjective Assessment - 04/19/17 1637    Subjective Patient reports he is having increased muscle spasms lateral aspect of thigh/quadriceps. He is working with stretching at home and feels improvement.    Limitations Sitting;Standing;Walking;House hold activities;Other (comment)   Patient Stated Goals to be able to perform personal care activites and return to normal activites without limitaitons (prior level of function)   Currently in Pain? No/denies      Objective: Gait: ambulating independently without AD and wearing brace on right knee with increased trunk flexion to right during stance phase Palpation: soft tissue mobility right LE decreased with moderate spasms anterior aspect right thigh quadriceps and patella tendon and along lateral thigh/quadriceps AAROM: right knee flexion to 60 pre/post treatment with pain, stiffness limiting further motion  Treatment: Manual therapy: Patella mobilization/distraction with glides superior/inferior and medial/lateral x 3 sets STM right quadriceps, central and lateral aspect, patella tendon and  hamstring muscles (patient seated, supine and prone lying)x .superficial techniques; joint mobilization tibia on femur posterior glides grade 2-3 multiple repetitions followed by ROM into flexion with patient seated on edge of treatment table with LE supported; goal improve soft tissue elasticity, decrease spasms, improve ROM  Therapeutic exercise: goal: improve knee flexion, independent with home program; patient performed with assistance, VC of therapist AAROM right knee into flexion multiple reps/sets in conjunction with manual techniques with patient seated and prone lying Knee flexion over edge of treatment table with manual assist for pprolonged stretch with constant monitoring for pain, correct alignment x 5 min. With patient reporting soreness and improved flexibility into flexion; ROM 60 - 64 degrees Leg press 55# bilateral x 15 reps Knee flexion on OMEGA cable machine 35# 2 x 15 reps Hip rotary machine for hip strengthening: hip extension each LE 70# x 15 reps with guidance, VC and demonstration for correct alignment and technique; increased soreness/pain in right hip with stabilization for left hip exercises, no worse following exercise  Patient response to treatment: patient demonstrated improved technique with exercises with minimal VC for correct positioning for correct alignment. Patient with decreased improved soft tissue elasticity by 50% following STM.          PT Education - 04/19/17 1723    Education provided Yes   Education Details re assessed home exercises, stretching, strengthening right LE   Person(s) Educated Patient   Methods Explanation;Demonstration;Verbal cues   Comprehension Verbalized understanding;Returned demonstration;Verbal cues required             PT Long Term Goals - 03/18/17 1821  PT LONG TERM GOAL #1   Title Patient will improve AAROM right knee up to 70 degrees by 04/29/2017 to allow him to sit and walk with more normal gait  pattern and to allow decreased pain in knee   Baseline AAROM right knee 0-10 degrees flexion with pain; 10/26/16 0-50 degreed flexion; 65 degrees flextion 11/19/16; 12/31/16 60 degrees flexion 02/11/17 0-50; 03/18/17 0-60    Status Revised     PT LONG TERM GOAL #2   Title patient will improve LEFS to 50/80 by 04/29/17 to demonstrate improving function with daily activities    Baseline LEFS 0/80; 10/26/16 deferred; 11/19/16 25/80; 02/11/17 30/80; 03/18/2017 40/80   Status Revised     PT LONG TERM GOAL #3   Title patient will have pain level max of 5/10 by 04/29/2017 to allow improved function with moving, sitting and walking   Baseline pain level max 10/10 with aggravating activities; 10/26/16 no pain reported unless moving right leg/knee then increases to 8-10/10   Status On-going     PT LONG TERM GOAL #4   Title patient will be able to walk without restrictive device safely on level surfaces and stairs by 6/14//2018 to improve function with household and community activities   Baseline walking with rolling walker, WBAT RLE (non weight bearing due to pain); ambulating with axillary crutches, weaning to one crutch 11/19/16; 01/28/17 walking with one crutch and intermittently no crutches   Status Revised     PT LONG TERM GOAL #5   Title patient will be independent with home program for pain control, flexibiltiy, strength for right LE by discharge 04/29/17 in order to continue to improve function with self management   Baseline requires assistance and guidance for all exercises, limited knowledge of appropriate pain control strategies, progression of exercises    Status Revised               Plan - 04/19/17 1734    Clinical Impression Statement patient continues with stiffness and pain in right LE s/p fracture of femur that is still healing. He has decreased ROM right knee with spasms of lateral quadriceps, distal quadriceps and decreased strength in right hip abduciton/extension. He wll benefit from  additional physical therapy intervention to achieve maximal return and decreased pain for ambulation and functional daily tasks.    Rehab Potential Good   PT Frequency 2x / week   PT Duration 6 weeks   PT Treatment/Interventions Electrical Stimulation;Gait training;Moist Heat;Therapeutic activities;Therapeutic exercise;Balance training;Neuromuscular re-education;Patient/family education;Passive range of motion;Scar mobilization;Manual techniques   PT Next Visit Plan modalities for pain control, muscle spasms, manual soft tissue mobilization, therapeutic exercise for ROM, gait   PT Home Exercise Plan continue with SLR, knee flexion, extension, weight shifting and pain control      Patient will benefit from skilled therapeutic intervention in order to improve the following deficits and impairments:  Decreased strength, Decreased balance, Hypomobility, Impaired flexibility, Pain, Impaired perceived functional ability, Decreased activity tolerance, Decreased endurance, Increased muscle spasms, Difficulty walking, Decreased range of motion  Visit Diagnosis: Stiffness of right knee, not elsewhere classified  Muscle weakness (generalized)  Pain in right leg  Difficulty in walking, not elsewhere classified  Other muscle spasm     Problem List There are no active problems to display for this patient.   Beacher May PT 04/19/2017, 6:57 PM  [place] [facility] [address], [place], [address] Phone: [phone]   Fax:  [phone]  Name:  Fabienne BrunsMatthew T Seawright MRN: [id number] Date of Birth: March 10, 1981

## 2017-04-20 ENCOUNTER — Ambulatory Visit: Payer: Self-pay | Admitting: Physical Therapy

## 2017-04-22 ENCOUNTER — Ambulatory Visit: Payer: Self-pay | Admitting: Physical Therapy

## 2017-04-22 ENCOUNTER — Emergency Department (HOSPITAL_COMMUNITY): Payer: Self-pay

## 2017-04-22 ENCOUNTER — Encounter (HOSPITAL_COMMUNITY): Payer: Self-pay | Admitting: Emergency Medicine

## 2017-04-22 ENCOUNTER — Emergency Department (HOSPITAL_COMMUNITY)
Admission: EM | Admit: 2017-04-22 | Discharge: 2017-04-22 | Disposition: A | Discharge status: Home / Self Care | Payer: Self-pay | Attending: Emergency Medicine | Admitting: Emergency Medicine

## 2017-04-22 DIAGNOSIS — M79604 Pain in right leg: Secondary | ICD-10-CM

## 2017-04-22 DIAGNOSIS — F17210 Nicotine dependence, cigarettes, uncomplicated: Secondary | ICD-10-CM | POA: Insufficient documentation

## 2017-04-22 DIAGNOSIS — Z885 Allergy status to narcotic agent status: Secondary | ICD-10-CM | POA: Insufficient documentation

## 2017-04-22 DIAGNOSIS — M25561 Pain in right knee: Secondary | ICD-10-CM | POA: Insufficient documentation

## 2017-04-22 MED ORDER — IBUPROFEN 600 MG PO TABS: 600.0000 mg | ORAL_TABLET | Freq: Four times a day (QID) | ORAL | 0 refills | Status: DC | PRN

## 2017-04-22 MED ORDER — TRAMADOL HCL 50 MG PO TABS: 50.0000 mg | ORAL_TABLET | Freq: Four times a day (QID) | ORAL | 0 refills | Status: DC | PRN
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
## 2017-04-22 NOTE — ED Notes (Signed)
Pt returned from xray

## 2017-04-22 NOTE — Discharge Instructions (Signed)
Rest - please stay off right leg as much as possible for the next couple days. You can put weight on it as tolerated Ice - ice for 20 minutes at a time, several times a day Compression - wear brace to provide support Elevate - elevate right leg above level of heart Ibuprofen - take with food. Take up to 3-4 times daily Tramadol - take as needed for moderate-severe pain

## 2017-04-22 NOTE — ED Notes (Signed)
Ice pack applied.
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
## 2017-04-22 NOTE — Progress Notes (Signed)
Orthopedic Tech Progress Note Patient Details:  Peter Stanley 10-31-1981 [id number]  Ortho Devices Type of Ortho Device: Knee Immobilizer Ortho Device/Splint Interventions: Application   Saul FordyceJennifer C [name] 04/22/2017, 11:26 AM

## 2017-04-22 NOTE — ED Notes (Signed)
Patient transported to X-ray 

## 2017-04-22 NOTE — ED Notes (Addendum)
Ortho tech paged. Victorino DikeJennifer to apply immobilizer.

## 2017-04-27 ENCOUNTER — Ambulatory Visit: Payer: Self-pay | Admitting: Physical Therapy

## 2017-04-27 ENCOUNTER — Encounter: Payer: Self-pay | Admitting: Physical Therapy

## 2017-04-27 DIAGNOSIS — R262 Difficulty in walking, not elsewhere classified: Secondary | ICD-10-CM

## 2017-04-27 DIAGNOSIS — M62838 Other muscle spasm: Secondary | ICD-10-CM

## 2017-04-27 DIAGNOSIS — M6281 Muscle weakness (generalized): Secondary | ICD-10-CM

## 2017-04-27 DIAGNOSIS — M79604 Pain in right leg: Secondary | ICD-10-CM

## 2017-04-27 DIAGNOSIS — M25661 Stiffness of right knee, not elsewhere classified: Secondary | ICD-10-CM

## 2017-04-27 NOTE — Therapy (Signed)
[place] [facility] [address] [place], [place], [address] Phone: [phone]   Fax:  [phone]  Physical Therapy Treatment  Patient Details  Name: Fabienne BrunsMatthew T Caffey MRN: [id number] Date of Birth: 03/03/1981 Referring Provider: Abram Sanderpatterson, jennifer MD  Encounter Date: 04/27/2017      PT End of Session - 04/27/17 1801    Visit Number 54   Number of Visits 60   Date for PT Re-Evaluation 04/29/17   PT Start Time 1715   PT Stop Time 1800   PT Time Calculation (min) 45 min   Activity Tolerance Patient limited by pain;Patient tolerated treatment well   Behavior [facility] for tasks assessed/performed      Past Medical History:  Diagnosis Date  . Allergy   . Anxiety     History reviewed. No pertinent surgical history.  There were no vitals filed for this visit.      Subjective Assessment - 04/27/17 1732    Subjective Patient reports he was seen in ED for right knee pain following slip in tub at home. X rays were taken and no fracture was reported.     Limitations Sitting;Standing;Walking;House hold activities;Other (comment)   Patient Stated Goals to be able to perform personal care activites and return to normal activites without limitaitons (prior level of function)   Currently in Pain? Other (Comment)  at rest feels weak and with weight bearing may increase to 10/10        Objective: Gait: antalgic pattern with wide base of support with right LE out to side Palpation: soft tissue mobility right LE decreased with moderate spasms anterior aspect right thigh quadriceps and patella tendon and along lateral thigh/quadriceps  Treatment: NuStep x 5 min. Warm up prior to exercising (unbilled time) Therapeutic exercise: goal: improve knee flexion, independent with home program; patient performed with assistance, VC of therapist AAROM right knee into flexion multiple reps/sets in conjunction with manual  techniques with patient seated Knee flexion over edge of treatment table with manual assist for pprolonged stretch with constant monitoring for pain, correct alignment x 5 min. With patient reporting soreness and improved flexibility into flexion Leg press 55# bilateral x 15 reps, 65# x 15 reps Knee flexion on OMEGA cable machine 35# 2 x 15 reps Hip rotary machine for hip strengthening: hip extension each LE 100# x 15 reps with guidance, VC and demonstration for correct alignment and technique; increased soreness/pain in right hip with stabilization for left hip exercises, no worse following exercise Staggered balance stones (6) for walking across x 5 sets; improved balance and technique with repetition  Patient response to treatment: improved motor control with all exercises with repetition. Patient improved technique and good alignment with minimal VC and assistance.          PT Education - 04/27/17 1800    Education provided Yes   Education Details exercise instruction for good alignment, intensity   Person(s) Educated Patient   Methods Explanation;Demonstration;Verbal cues   Comprehension Verbalized understanding;Returned demonstration;Verbal cues required             PT Long Term Goals - 03/18/17 1821      PT LONG TERM GOAL #1   Title Patient will improve AAROM right knee up to 70 degrees by 04/29/2017 to allow him to sit and walk with more normal gait pattern and to allow decreased pain in knee   Baseline AAROM right knee 0-10 degrees flexion with pain; 10/26/16 0-50 degreed  flexion; 65 degrees flextion 11/19/16; 12/31/16 60 degrees flexion 02/11/17 0-50; 03/18/17 0-60    Status Revised     PT LONG TERM GOAL #2   Title patient will improve LEFS to 50/80 by 04/29/17 to demonstrate improving function with daily activities    Baseline LEFS 0/80; 10/26/16 deferred; 11/19/16 25/80; 02/11/17 30/80; 03/18/2017 40/80   Status Revised     PT LONG TERM GOAL #3   Title patient will have pain  level max of 5/10 by 04/29/2017 to allow improved function with moving, sitting and walking   Baseline pain level max 10/10 with aggravating activities; 10/26/16 no pain reported unless moving right leg/knee then increases to 8-10/10   Status On-going     PT LONG TERM GOAL #4   Title patient will be able to walk without restrictive device safely on level surfaces and stairs by 6/14//2018 to improve function with household and community activities   Baseline walking with rolling walker, WBAT RLE (non weight bearing due to pain); ambulating with axillary crutches, weaning to one crutch 11/19/16; 01/28/17 walking with one crutch and intermittently no crutches   Status Revised     PT LONG TERM GOAL #5   Title patient will be independent with home program for pain control, flexibiltiy, strength for right LE by discharge 04/29/17 in order to continue to improve function with self management   Baseline requires assistance and guidance for all exercises, limited knowledge of appropriate pain control strategies, progression of exercises    Status Revised               Plan - 04/27/17 1802    Clinical Impression Statement Patient demonstrates continued pain and stiffness in right knee and had slipped in his tub with pain and popping felt in right knee. He was able to participate in therapy today without increased pain and was able to walk and weight bear through right LE with improvement at end of session.    Rehab Potential Good   PT Frequency 2x / week   PT Duration 6 weeks   PT Treatment/Interventions Electrical Stimulation;Gait training;Moist Heat;Therapeutic activities;Therapeutic exercise;Balance training;Neuromuscular re-education;Patient/family education;Passive range of motion;Scar mobilization;Manual techniques   PT Next Visit Plan modalities for pain control, muscle spasms, manual soft tissue mobilization, therapeutic exercise for ROM, gait   PT Home Exercise Plan continue with SLR, knee  flexion, extension, weight shifting and pain control      Patient will benefit from skilled therapeutic intervention in order to improve the following deficits and impairments:  Decreased strength, Decreased balance, Hypomobility, Impaired flexibility, Pain, Impaired perceived functional ability, Decreased activity tolerance, Decreased endurance, Increased muscle spasms, Difficulty walking, Decreased range of motion  Visit Diagnosis: Stiffness of right knee, not elsewhere classified  Muscle weakness (generalized)  Pain in right leg  Difficulty in walking, not elsewhere classified  Other muscle spasm     Problem List There are no active problems to display for this patient.   Beacher May PT 04/28/2017, 7:41 PM  [place] [facility] [address] [place], [address] Phone: [phone]   Fax:  [phone]  Name: ABISHAI VIEGAS MRN: [id number] Date of Birth: Jul 11, 1981

## 2017-04-29 ENCOUNTER — Encounter: Payer: Self-pay | Admitting: Physical Therapy

## 2017-04-29 ENCOUNTER — Ambulatory Visit: Payer: Self-pay | Admitting: Physical Therapy

## 2017-04-29 DIAGNOSIS — M6281 Muscle weakness (generalized): Secondary | ICD-10-CM

## 2017-04-29 DIAGNOSIS — M25661 Stiffness of right knee, not elsewhere classified: Secondary | ICD-10-CM

## 2017-04-29 DIAGNOSIS — R262 Difficulty in walking, not elsewhere classified: Secondary | ICD-10-CM

## 2017-04-29 DIAGNOSIS — M62838 Other muscle spasm: Secondary | ICD-10-CM

## 2017-04-29 DIAGNOSIS — M79604 Pain in right leg: Secondary | ICD-10-CM
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
## 2017-05-03 ENCOUNTER — Ambulatory Visit: Payer: Self-pay | Admitting: Physical Therapy

## 2017-05-05 ENCOUNTER — Encounter: Payer: Self-pay | Admitting: Physical Therapy

## 2017-05-05 ENCOUNTER — Ambulatory Visit: Payer: Self-pay | Admitting: Physical Therapy

## 2017-05-05 DIAGNOSIS — M62838 Other muscle spasm: Secondary | ICD-10-CM

## 2017-05-05 DIAGNOSIS — R262 Difficulty in walking, not elsewhere classified: Secondary | ICD-10-CM

## 2017-05-05 DIAGNOSIS — M6281 Muscle weakness (generalized): Secondary | ICD-10-CM

## 2017-05-05 DIAGNOSIS — M25661 Stiffness of right knee, not elsewhere classified: Secondary | ICD-10-CM

## 2017-05-05 DIAGNOSIS — M79604 Pain in right leg: Secondary | ICD-10-CM

## 2017-05-06 NOTE — Therapy (Signed)
[facility] [address], [place], [address] Phone: [phone]   Fax:  [phone]  Physical Therapy Treatment  Patient Details  Name: Peter Stanley MRN: [id number] Date of Birth: 11/17/1980 Referring Provider: Maureen Chatters MD  Encounter Date: 05/05/2017      PT End of Session - 05/05/17 1718    Visit Number 56   Number of Visits 72   Date for PT Re-Evaluation 06/17/17   PT Start Time 3846   PT Stop Time 6599   PT Time Calculation (min) 39 min   Activity Tolerance Patient limited by pain;Patient tolerated treatment well   Behavior During Therapy [facility] for tasks assessed/performed      Past Medical History:  Diagnosis Date  . Allergy   . Anxiety     History reviewed. No pertinent surgical history.  There were no vitals filed for this visit.      Subjective Assessment - 05/05/17 1716    Subjective Patient is walking much better and able to walk without AD or brace   Limitations Sitting;Standing;Walking;House hold activities;Other (comment)   Patient Stated Goals to be able to perform personal care activites and return to normal activites without limitaitons (prior level of function)   Currently in Pain? No/denies        Objective: Gait: antalgic pattern with wide base of support with right LE out to side Palpation: soft tissue mobility right LE decreased with moderate spasms anterior aspect right thigh quadriceps and patella tendon and along lateral thigh/quadriceps  Treatment: Therapeutic exercise: goal: improve knee flexion, independent with home program; patient performed with assistance, VC of therapist Leg press 55# bilateral 2 x 15 reps, Single leg Leg press 55# x 15 reps each LE with increased effort and pain in right knee with increased flexion Hip rotary machine for hip strengthening: hip extension each LE 100# x 15 reps with guidance, VC and demonstration for correct  alignment and technique; increased soreness/pain in right hip with stabilization for left hip exercises, no worse following exercise Staggered balance stones (6) for walking across x 5 sets; improved balance and technique with repetition Step ups onto balance stones leading with each LE x 10 reps  Sitting at edge of treatment table: Manual therapy 29mn: STM performed to right quadriceps muscle in conjunction with exercises: AAROM right knee flexion/extension with isometric holds 5 reps into knee flexion to improve ROM; patella mobilization with patella mobilizer x 3 reps distraction and inferior/superior glides; joint mobilization tibia on femur posterior glides grade 2-3 multiple reps and sets to improve flexion.   Patient response to treatment: Patient demonstrated improved ROM into flexion from 55 to 62 degrees following STM, mobilizations of right knee. Patient with increased pain with all knee flexion exercises at ~60 degrees.       PT Education - 05/05/17 1717    Education provided Yes   Education Details HEP: work on ROM and continue with walking for endurance   Person(s) Educated Patient   Methods Explanation   Comprehension Verbalized understanding             PT Long Term Goals - 04/29/17 1743      PT LONG TERM GOAL #1   Title Patient will improve AAROM right knee up to 70 degrees by 06/17/2017 to allow him to sit and walk with more normal gait pattern and to allow decreased pain in knee   Baseline AAROM right knee 0-10 degrees flexion with pain;  10/26/16 0-50 degreed flexion; 65 degrees flextion 11/19/16; 12/31/16 60 degrees flexion 02/11/17 0-50; 03/18/17 0-60 ;04/29/17 0-60   Status Revised     PT LONG TERM GOAL #2   Title patient will improve LEFS to 50/80 by 06/17/17 to demonstrate improving function with daily activities    Baseline LEFS 0/80; 10/26/16 deferred; 11/19/16 25/80; 02/11/17 30/80; 03/18/2017 40/80; 04/29/17 to be assessed   Status Revised     PT LONG TERM GOAL #3    Title patient will have pain level max of 5/10 by 06/17/2017 to allow improved function with moving, sitting and walking   Baseline pain level max 10/10 with aggravating activities; intermittent pain with certain movement and tryning to bend knee to full ROM   Status Revised     PT LONG TERM GOAL #4   Title patient will be able to walk without assistive device safely on level surfaces and stairs by 8/2//2018 to improve function with household and community activities   Baseline  01/28/17 walking with one crutch and intermittently no crutches; 04/29/2017 ambulating without AD with limp and wide base of support,    Status Partially Met     PT LONG TERM GOAL #5   Title patient will be independent with home program for pain control, flexibiltiy, strength for right LE by discharge 06/17/17 in order to continue to improve function with self management   Baseline requires assistance and guidance for all exercises, limited knowledge of appropriate pain control strategies, progression of exercises    Status Revised               Plan - 05/05/17 1718    Clinical Impression Statement patient is progressing slowly and continues with stiffness and pain in right LE s/p fracture of femur that continues to heal. He continues with limitations of ROM and strength and will benefit from continues physical therapy intervention to further imrpove functional use of right LE.    Rehab Potential Good   PT Frequency 2x / week   PT Duration 6 weeks   PT Treatment/Interventions Electrical Stimulation;Gait training;Moist Heat;Therapeutic activities;Therapeutic exercise;Balance training;Neuromuscular re-education;Patient/family education;Passive range of motion;Scar mobilization;Manual techniques   PT Next Visit Plan modalities for pain control, muscle spasms, manual soft tissue mobilization, therapeutic exercise for ROM, gait   PT Home Exercise Plan continue with SLR, knee flexion, extension, weight shifting and pain  control      Patient will benefit from skilled therapeutic intervention in order to improve the following deficits and impairments:  Decreased strength, Decreased balance, Hypomobility, Impaired flexibility, Pain, Impaired perceived functional ability, Decreased activity tolerance, Decreased endurance, Increased muscle spasms, Difficulty walking, Decreased range of motion  Visit Diagnosis: Stiffness of right knee, not elsewhere classified  Muscle weakness (generalized)  Pain in right leg  Difficulty in walking, not elsewhere classified  Other muscle spasm     Problem List There are no active problems to display for this patient.   Jomarie Longs PT 05/06/2017, 2:50 PM  [facility] [address] [place], [address] Phone: [phone]   Fax:  [phone]  Name: Peter Stanley MRN: [id number] Date of Birth: [birth year]

## 2017-05-11 ENCOUNTER — Encounter: Payer: Self-pay | Admitting: Physical Therapy

## 2017-05-11 ENCOUNTER — Ambulatory Visit: Payer: Self-pay | Admitting: Physical Therapy

## 2017-05-11 DIAGNOSIS — M79604 Pain in right leg: Secondary | ICD-10-CM

## 2017-05-11 DIAGNOSIS — M25661 Stiffness of right knee, not elsewhere classified: Secondary | ICD-10-CM

## 2017-05-11 DIAGNOSIS — M6281 Muscle weakness (generalized): Secondary | ICD-10-CM

## 2017-05-11 DIAGNOSIS — M62838 Other muscle spasm: Secondary | ICD-10-CM

## 2017-05-11 DIAGNOSIS — R262 Difficulty in walking, not elsewhere classified: Secondary | ICD-10-CM

## 2017-05-11 NOTE — Therapy (Signed)
[facility] [address], [place], [address] Phone: [phone]   Fax:  [phone]  Physical Therapy Treatment  Patient Details  Name: Peter Stanley MRN: [id number] Date of Birth: 18-Apr-1981 Referring Provider: Maureen Chatters MD  Encounter Date: 05/11/2017      PT End of Session - 05/11/17 1804    Visit Number 33   Number of Visits 72   Date for PT Re-Evaluation 06/17/17   PT Start Time 5465   PT Stop Time 0354   PT Time Calculation (min) 32 min   Activity Tolerance Patient limited by pain;Patient tolerated treatment well;Patient limited by fatigue   Behavior During Therapy [facility] for tasks assessed/performed      Past Medical History:  Diagnosis Date  . Allergy   . Anxiety     History reviewed. No pertinent surgical history.  There were no vitals filed for this visit.      Subjective Assessment - 05/11/17 1726    Subjective Patient reports continues stiffness in right knee and he is beginning to walk with less difficulty.    Limitations Sitting;Standing;Walking;House hold activities;Other (comment)   Patient Stated Goals to be able to perform personal care activites and return to normal activites without limitaitons (prior level of function)   Currently in Pain? No/denies      Objective: Gait: antalgic pattern with wide base of support with right LE out to side Palpation: soft tissue mobility right LE decreased with moderate spasms anterior aspect right thigh quadriceps and patella tendon and along lateral thigh/quadriceps  Treatment: Therapeutic exercise: goal: improve knee flexion, independent with home program; patient performed with assistance, VC and demonstration of therapist Leg press 55# bilateral 2 x 15 reps, Single leg Leg press 55# x 15 reps each LE with increased effort and pain in right knee with increased flexion Hip rotary machine for hip strengthening: hip  extension each LE 115# x 15 reps with guidance, VC and demonstration for correct alignment and technique; increased soreness/pain in right hip with stabilization for left hip exercises, no worse following exercise Hip abduction 55# each LE x 15 reps with fatigue in stance LE Knee flexion at [employer]ME[employer] cable machine 25# single and bilateral LE's multiple sets x 10 reps Walking diagonal while holding (2) 7# weights in hands x 2 min. With moderate fatigue, needed to rest Standing single leg stand with one LE on BOSU ball for balance toss 4# ball at trampoline 15x with each LE balancing  Patient response to treatment: Patient improved stability with single leg standing and with hip exercises with repetition. He required close supervision, contact guard for safety with ball tossing, moderate fatigue limited session.           PT Education - 05/11/17 1803    Education provided Yes   Education Details exercise instruction, technique, posture, positioning: diagonal march with weights, single leg stand/balance ball toss   Person(s) Educated Patient   Methods Explanation;Demonstration;Verbal cues   Comprehension Verbalized understanding;Returned demonstration;Verbal cues required             PT Long Term Goals - 04/29/17 1743      PT LONG TERM GOAL #1   Title Patient will improve AAROM right knee up to 70 degrees by 06/17/2017 to allow him to sit and walk with more normal gait pattern and to allow decreased pain in knee   Baseline AAROM right knee 0-10 degrees flexion with pain; 10/26/16 0-50 degreed flexion; 65  degrees flextion 11/19/16; 12/31/16 60 degrees flexion 02/11/17 0-50; 03/18/17 0-60 ;04/29/17 0-60   Status Revised     PT LONG TERM GOAL #2   Title patient will improve LEFS to 50/80 by 06/17/17 to demonstrate improving function with daily activities    Baseline LEFS 0/80; 10/26/16 deferred; 11/19/16 25/80; 02/11/17 30/80; 03/18/2017 40/80; 04/29/17 to be assessed   Status Revised     PT LONG TERM  GOAL #3   Title patient will have pain level max of 5/10 by 06/17/2017 to allow improved function with moving, sitting and walking   Baseline pain level max 10/10 with aggravating activities; intermittent pain with certain movement and tryning to bend knee to full ROM   Status Revised     PT LONG TERM GOAL #4   Title patient will be able to walk without assistive device safely on level surfaces and stairs by 8/2//2018 to improve function with household and community activities   Baseline  01/28/17 walking with one crutch and intermittently no crutches; 04/29/2017 ambulating without AD with limp and wide base of support,    Status Partially Met     PT LONG TERM GOAL #5   Title patient will be independent with home program for pain control, flexibiltiy, strength for right LE by discharge 06/17/17 in order to continue to improve function with self management   Baseline requires assistance and guidance for all exercises, limited knowledge of appropriate pain control strategies, progression of exercises    Status Revised               Plan - 05/11/17 1805    Clinical Impression Statement Patient continues to demonstrate improvement with strength and gait. Patient demonstrates steady progress towards goals with improvement noted in strength, endurance. Improved functional use note with right LE with improved gait pattern following exercises with decreased base of support and increased weight shift to right LE.  Patient will benefit from continued physical therapy intervention to address limitations and achieve goals.   Rehab Potential Good   PT Frequency 2x / week   PT Duration 6 weeks   PT Treatment/Interventions Electrical Stimulation;Gait training;Moist Heat;Therapeutic activities;Therapeutic exercise;Balance training;Neuromuscular re-education;Patient/family education;Passive range of motion;Scar mobilization;Manual techniques   PT Next Visit Plan modalities for pain control, muscle spasms,  manual soft tissue mobilization, therapeutic exercise for ROM, gait   PT Home Exercise Plan continue with SLR, knee flexion, extension, weight shifting and pain control, diagonal walk with/without weights      Patient will benefit from skilled therapeutic intervention in order to improve the following deficits and impairments:  Decreased strength, Decreased balance, Hypomobility, Impaired flexibility, Pain, Impaired perceived functional ability, Decreased activity tolerance, Decreased endurance, Increased muscle spasms, Difficulty walking, Decreased range of motion  Visit Diagnosis: Stiffness of right knee, not elsewhere classified  Muscle weakness (generalized)  Pain in right leg  Difficulty in walking, not elsewhere classified  Other muscle spasm     Problem List There are no active problems to display for this patient.   Jomarie Longs PT 05/11/2017, 6:07 PM  [facility] [address] [place], [address] Phone: [phone]   Fax:  [phone]  Name: ROSHAUN POUND MRN: [id number] Date of Birth: 1980/12/31

## 2017-05-13 ENCOUNTER — Ambulatory Visit: Payer: Self-pay | Admitting: Physical Therapy

## 2017-05-17 ENCOUNTER — Ambulatory Visit: Payer: Self-pay | Attending: Orthopedic Surgery | Admitting: Physical Therapy

## 2017-05-17 ENCOUNTER — Encounter: Payer: Self-pay | Admitting: Physical Therapy

## 2017-05-17 DIAGNOSIS — R262 Difficulty in walking, not elsewhere classified: Secondary | ICD-10-CM | POA: Insufficient documentation

## 2017-05-17 DIAGNOSIS — M25661 Stiffness of right knee, not elsewhere classified: Secondary | ICD-10-CM | POA: Insufficient documentation

## 2017-05-17 DIAGNOSIS — M62838 Other muscle spasm: Secondary | ICD-10-CM | POA: Insufficient documentation

## 2017-05-17 DIAGNOSIS — M6281 Muscle weakness (generalized): Secondary | ICD-10-CM | POA: Insufficient documentation

## 2017-05-17 DIAGNOSIS — M79604 Pain in right leg: Secondary | ICD-10-CM | POA: Insufficient documentation

## 2017-05-17 NOTE — Therapy (Signed)
[facility] [address], [place], [address] Phone: [phone]   Fax:  [phone]  Physical Therapy Treatment  Patient Details  Name: Peter Stanley MRN: [id number] Date of Birth: [birth year] Referring Provider: Maureen Chatters MD  Encounter Date: 05/17/2017      PT End of Session - 05/17/17 1609    Visit Number 106   Number of Visits 72   Date for PT Re-Evaluation 06/17/17   PT Start Time 0923   PT Stop Time 3007   PT Time Calculation (min) 31 min   Activity Tolerance Patient limited by pain;Patient tolerated treatment well   Behavior During Therapy [facility] for tasks assessed/performed      Past Medical History:  Diagnosis Date  . Allergy   . Anxiety     History reviewed. No pertinent surgical history.  There were no vitals filed for this visit.      Subjective Assessment - 05/17/17 1604    Subjective Patient arriving 15 min. Late due to another appointment. Walking with less difficulty. reports right foot swelling with walking on leg more.    Limitations Sitting;Standing;Walking;House hold activities;Other (comment)   Patient Stated Goals to be able to perform personal care activites and return to normal activites without limitaitons (prior level of function)   Currently in Pain? No/denies        Objective: Gait: antalgic pattern with more normal base of support with right LE out to side, improved from previous session Strength: decreased right LE hip abduction, extension and knee flexion AROM right knee flexion 0-60  Treatment: Therapeutic exercise: goal: improve knee flexion, independent with home program; patient performed with assistance, VC and demonstration of therapist Leg press 65# bilateral 2 x 15 reps, Single leg Leg press 65# x 15 reps each LE with increased effort and pain in right knee with increased flexion Hip rotary machine for hip strengthening: hip extension each  LE 115# x 15 reps with guidance, VC and demonstration for correct alignment and technique; increased soreness/pain in right hip with stabilization for left hip exercises, no worse following exercise Hip abduction 55# each LE x 15 reps with fatigue in stance LE Knee flexion at OMEGA cable machine 25# single and bilateral LE's multiple sets x 10 reps Standing single leg stand with one LE on BOSU ball for balance toss 4# ball at trampoline 15 with each LE balancing (non contact guarding for safety)  Patient response to treatment: Patient demonstrated improved technique and ability to perform exercises with increased intensity with moderate discomfort right LE. Patient required minimal cuing and assistance to perform exercises with good technique. Required close supervision/non contact guard for safety with single leg standing exercise. Moderate fatigue noted at end of session with increased BOS during ambulation.          PT Education - 05/17/17 1607    Education provided Yes   Education Details HEP: continue to perform exercises    Person(s) Educated Patient   Methods Explanation;Demonstration;Verbal cues   Comprehension Returned demonstration;Verbal cues required;Verbalized understanding             PT Long Term Goals - 04/29/17 1743      PT LONG TERM GOAL #1   Title Patient will improve AAROM right knee up to 70 degrees by 06/17/2017 to allow him to sit and walk with more normal gait pattern and to allow decreased pain in knee   Baseline AAROM right knee 0-10 degrees flexion with  pain; 10/26/16 0-50 degreed flexion; 65 degrees flextion 11/19/16; 12/31/16 60 degrees flexion 02/11/17 0-50; 03/18/17 0-60 ;04/29/17 0-60   Status Revised     PT LONG TERM GOAL #2   Title patient will improve LEFS to 50/80 by 06/17/17 to demonstrate improving function with daily activities    Baseline LEFS 0/80; 10/26/16 deferred; 11/19/16 25/80; 02/11/17 30/80; 03/18/2017 40/80; 04/29/17 to be assessed   Status Revised      PT LONG TERM GOAL #3   Title patient will have pain level max of 5/10 by 06/17/2017 to allow improved function with moving, sitting and walking   Baseline pain level max 10/10 with aggravating activities; intermittent pain with certain movement and tryning to bend knee to full ROM   Status Revised     PT LONG TERM GOAL #4   Title patient will be able to walk without assistive device safely on level surfaces and stairs by 8/2//2018 to improve function with household and community activities   Baseline  01/28/17 walking with one crutch and intermittently no crutches; 04/29/2017 ambulating without AD with limp and wide base of support,    Status Partially Met     PT LONG TERM GOAL #5   Title patient will be independent with home program for pain control, flexibiltiy, strength for right LE by discharge 06/17/17 in order to continue to improve function with self management   Baseline requires assistance and guidance for all exercises, limited knowledge of appropriate pain control strategies, progression of exercises    Status Revised               Plan - 05/17/17 1610    Clinical Impression Statement Patient demonstrates improvement with gait pattern, endurance and strength. Patient demonstrates steady progress towards goals with improvement noted in  strength, endurance. Improved gait pattern demonstrated with more narrow base of support. Patient will benefit from continued physical therapy intervention to address limitations and achieve goals.     Rehab Potential Good   PT Frequency 2x / week   PT Duration 6 weeks   PT Treatment/Interventions Electrical Stimulation;Gait training;Moist Heat;Therapeutic activities;Therapeutic exercise;Balance training;Neuromuscular re-education;Patient/family education;Passive range of motion;Scar mobilization;Manual techniques   PT Next Visit Plan modalities for pain control, muscle spasms, manual soft tissue mobilization, therapeutic exercise for ROM, gait    PT Home Exercise Plan continue with SLR, knee flexion, extension, weight shifting and pain control      Patient will benefit from skilled therapeutic intervention in order to improve the following deficits and impairments:  Decreased strength, Decreased balance, Hypomobility, Impaired flexibility, Pain, Impaired perceived functional ability, Decreased activity tolerance, Decreased endurance, Increased muscle spasms, Difficulty walking, Decreased range of motion  Visit Diagnosis: Pain in right leg  Difficulty in walking, not elsewhere classified     Problem List There are no active problems to display for this patient.   Jomarie Longs PT 05/17/2017, 7:15 PM  [place] [facility] [address], [place], [address] Phone: [phone]   Fax:  [phone]  Name: Peter Stanley MRN: [id number] Date of Birth: 1981/04/06

## 2017-05-18 ENCOUNTER — Ambulatory Visit: Payer: Self-pay | Admitting: Physical Therapy

## 2017-05-20 ENCOUNTER — Ambulatory Visit: Payer: Self-pay | Admitting: Physical Therapy

## 2017-05-20 DIAGNOSIS — M6281 Muscle weakness (generalized): Secondary | ICD-10-CM

## 2017-05-20 DIAGNOSIS — M62838 Other muscle spasm: Secondary | ICD-10-CM

## 2017-05-20 DIAGNOSIS — M25661 Stiffness of right knee, not elsewhere classified: Secondary | ICD-10-CM

## 2017-05-20 DIAGNOSIS — M79604 Pain in right leg: Secondary | ICD-10-CM

## 2017-05-20 DIAGNOSIS — R262 Difficulty in walking, not elsewhere classified: Secondary | ICD-10-CM

## 2017-05-21 NOTE — Therapy (Signed)
[place] [facility] [address], [place], [address] Phone: [phone]   Fax:  [phone]  Physical Therapy Treatment  Patient Details  Name: Peter Stanley MRN: [id number] Date of Birth: Aug 12, 1981 Referring Provider: Maureen Chatters MD  Encounter Date: 05/20/2017      PT End of Session - 05/20/17 1630    Visit Number 28   Number of Visits 72   Date for PT Re-Evaluation 06/17/17   PT Start Time 7893   PT Stop Time 8101   PT Time Calculation (min) 40 min   Activity Tolerance Patient limited by pain;Patient tolerated treatment well   Behavior During Therapy [facility] for tasks assessed/performed      Past Medical History:  Diagnosis Date  . Allergy   . Anxiety     No past surgical history on file.  There were no vitals filed for this visit.      Subjective Assessment - 05/20/17 1600    Subjective Patient reports increased right LE hamstring spasms and tightness today and requests treatment focused on relievingg pain, spasms.    Limitations Sitting;Standing;Walking;House hold activities;Other (comment)   Patient Stated Goals to be able to perform personal care activites and return to normal activites without limitaitons (prior level of function)   Currently in Pain? Yes   Pain Score 6    Pain Location Leg   Pain Orientation Right;Posterior   Pain Descriptors / Indicators Tightness;Spasm   Pain Type Acute pain;Chronic pain   Pain Onset Today   Pain Frequency Intermittent      Objective: Gait: antalgic pattern with increased base of support with right LE out to side, increased from previous session Palpation; right LE hamstring muscle with moderate spasms palpable  Treatment: Manual therapy: goal: improve soft tissue elasticity, decrease pain and spasms STM performed x 15 min to right LE posterior thigh along hamstring and calf muscles with patient prone lying with pillow supporting LE:  superficial and deep techniques Therapeutic exercise: goal: improve knee flexion, independent with home program; patient performed with assistance, VC and demonstration of therapist Leg press 55# bilateral 3 x 15 reps, Single leg Leg press 55#  3 x 15 reps each LE with increased effort and pain in right knee with increased flexion NuStep x 15 min. Level #3 intensity; independent (unbiled time) for endurance, ROM  Patient response to treatment: Patient with decreased spasms and improved ability to walk with less difficulty following treatment. He required instruction and assistance to perform modified exercises with good technique and appropriate intensity.          PT Education - 05/20/17 1620    Education provided Yes   Education Details instructed in self management of spasms with heat, ice and to avoid strengthening until spasms decrease further   Person(s) Educated Patient   Methods Explanation;Verbal cues   Comprehension Verbalized understanding             PT Long Term Goals - 04/29/17 1743      PT LONG TERM GOAL #1   Title Patient will improve AAROM right knee up to 70 degrees by 06/17/2017 to allow him to sit and walk with more normal gait pattern and to allow decreased pain in knee   Baseline AAROM right knee 0-10 degrees flexion with pain; 10/26/16 0-50 degreed flexion; 65 degrees flextion 11/19/16; 12/31/16 60 degrees flexion 02/11/17 0-50; 03/18/17 0-60 ;04/29/17 0-60   Status Revised     PT LONG TERM GOAL #  2   Title patient will improve LEFS to 50/80 by 06/17/17 to demonstrate improving function with daily activities    Baseline LEFS 0/80; 10/26/16 deferred; 11/19/16 25/80; 02/11/17 30/80; 03/18/2017 40/80; 04/29/17 to be assessed   Status Revised     PT LONG TERM GOAL #3   Title patient will have pain level max of 5/10 by 06/17/2017 to allow improved function with moving, sitting and walking   Baseline pain level max 10/10 with aggravating activities; intermittent pain with certain  movement and tryning to bend knee to full ROM   Status Revised     PT LONG TERM GOAL #4   Title patient will be able to walk without assistive device safely on level surfaces and stairs by 8/2//2018 to improve function with household and community activities   Baseline  01/28/17 walking with one crutch and intermittently no crutches; 04/29/2017 ambulating without AD with limp and wide base of support,    Status Partially Met     PT LONG TERM GOAL #5   Title patient will be independent with home program for pain control, flexibiltiy, strength for right LE by discharge 06/17/17 in order to continue to improve function with self management   Baseline requires assistance and guidance for all exercises, limited knowledge of appropriate pain control strategies, progression of exercises    Status Revised               Plan - 05/20/17 1632    Clinical Impression Statement Patient demonstrated improvement with spasms and able to perform modified exercises to avoid increased spasms in right LE. Patient demonstrates steady progress towards goals with improvement noted in functional use with right LE. Improved gait pattern demonstrated with more normal base of support following treatment.. Patient will benefit from continued physical therapy intervention to address limitations and achieve goals.    Rehab Potential Good   PT Frequency 2x / week   PT Duration 6 weeks   PT Treatment/Interventions Electrical Stimulation;Gait training;Moist Heat;Therapeutic activities;Therapeutic exercise;Balance training;Neuromuscular re-education;Patient/family education;Passive range of motion;Scar mobilization;Manual techniques   PT Next Visit Plan modalities for pain control, muscle spasms, manual soft tissue mobilization, therapeutic exercise for ROM, gait   PT Home Exercise Plan continue with SLR, knee flexion, extension, weight shifting and pain control      Patient will benefit from skilled therapeutic  intervention in order to improve the following deficits and impairments:  Decreased strength, Decreased balance, Hypomobility, Impaired flexibility, Pain, Impaired perceived functional ability, Decreased activity tolerance, Decreased endurance, Increased muscle spasms, Difficulty walking, Decreased range of motion  Visit Diagnosis: Pain in right leg  Other muscle spasm  Difficulty in walking, not elsewhere classified  Stiffness of right knee, not elsewhere classified  Muscle weakness (generalized)     Problem List There are no active problems to display for this patient.   Jomarie Longs PT 05/21/2017, 10:05 AM  [place] [facility] [address], [place], [address] Phone: [phone]   Fax:  [phone]  Name: Peter Stanley MRN: [id number] Date of Birth: [birth year]

## 2017-05-25 ENCOUNTER — Encounter: Payer: Self-pay | Admitting: Physical Therapy

## 2017-05-25 ENCOUNTER — Ambulatory Visit: Payer: Self-pay | Admitting: Physical Therapy

## 2017-05-25 DIAGNOSIS — M79604 Pain in right leg: Secondary | ICD-10-CM

## 2017-05-25 DIAGNOSIS — R262 Difficulty in walking, not elsewhere classified: Secondary | ICD-10-CM

## 2017-05-25 DIAGNOSIS — M62838 Other muscle spasm: Secondary | ICD-10-CM

## 2017-05-25 DIAGNOSIS — M6281 Muscle weakness (generalized): Secondary | ICD-10-CM

## 2017-05-25 DIAGNOSIS — M25661 Stiffness of right knee, not elsewhere classified: Secondary | ICD-10-CM

## 2017-05-25 NOTE — Therapy (Signed)
[place] [facility] [address], [place], [address] Phone: [phone]   Fax:  [phone]  Physical Therapy Treatment  Patient Details  Name: Peter Stanley MRN: [id number] Date of Birth: 1981-08-11 Referring Provider: Maureen Chatters MD  Encounter Date: 05/25/2017      PT End of Session - 05/25/17 1820    Visit Number 60   Number of Visits 72   Date for PT Re-Evaluation 06/17/17   PT Start Time 0630   PT Stop Time 1601   PT Time Calculation (min) 53 min   Activity Tolerance Patient limited by pain;Patient tolerated treatment well   Behavior During Therapy [facility] for tasks assessed/performed      Past Medical History:  Diagnosis Date  . Allergy   . Anxiety     History reviewed. No pertinent surgical history.  There were no vitals filed for this visit.      Subjective Assessment - 05/25/17 1726    Subjective Patient reports he is feeling right knee improving with flexibility with NuStep, hip rotatry machine and hamstring curl machine and leg press.    Limitations Sitting;Standing;Walking;House hold activities;Other (comment)   Patient Stated Goals to be able to perform personal care activites and return to normal activites without limitaitons (prior level of function)   Currently in Pain? No/denies      Objective: Gait: antalgic pattern with increasedbase of support with right LE out to side, decreased from previous session Palpation: decreased soft tissue elasticity right knee quadriceps and along anterior aspect of knee   Treatment: Manual therapy: 15 min. Goal: improve soft tissue elasticity, pain, ROM STM including myofascial release performed to right LE quadriceps and knee with patient seated at edge of treatment table Therapeutic exercise: goal: improve knee flexion, independent with home program; patient performed with assistance, VC and demonstration of therapist Leg press 65#  bilateral 2 x 15 reps, Single leg Leg press 65# x 10 reps each LE with increased effort and pain in right knee with increased flexion Hip rotary machine for hip strengthening: hip extension each LE 115# x 15 reps with guidance, VC and demonstration for correct alignment and technique; increased soreness/pain in right hip with stabilization for left hip exercises, no worse following exercise Hip abduction 70# each LE x 15 reps with fatigue in stance LE Knee flexion at [employer]ME[employer] cable machine 25# single and bilateral LE's multiple sets x 10 reps Standing single leg stand with one LE on BOSU ball for balance toss 4# ball at trampoline 15 with each LE balancing (non contact guarding for safety)  NuStep x 15 min. Level #5 intensity; independent (unbilled time) for endurance, ROM  Patient response to treatment: Patient demonstrated improved AAROM from 60 to 70 degrees following STM and exercises. Patient required minimal assistance and VC to perform exercises with proper technique and alignment.         PT Education - 05/25/17 1804    Education provided Yes   Education Details instructed in proper gait pattern with decrased base of support, instruction in proper technique   Person(s) Educated Patient   Methods Explanation;Demonstration;Verbal cues   Comprehension Verbalized understanding;Returned demonstration;Verbal cues required             PT Long Term Goals - 04/29/17 1743      PT LONG TERM GOAL #1   Title Patient will improve AAROM right knee up to 70 degrees by 06/17/2017 to allow him to sit and walk with  more normal gait pattern and to allow decreased pain in knee   Baseline AAROM right knee 0-10 degrees flexion with pain; 10/26/16 0-50 degreed flexion; 65 degrees flextion 11/19/16; 12/31/16 60 degrees flexion 02/11/17 0-50; 03/18/17 0-60 ;04/29/17 0-60   Status Revised     PT LONG TERM GOAL #2   Title patient will improve LEFS to 50/80 by 06/17/17 to demonstrate improving function with daily  activities    Baseline LEFS 0/80; 10/26/16 deferred; 11/19/16 25/80; 02/11/17 30/80; 03/18/2017 40/80; 04/29/17 to be assessed   Status Revised     PT LONG TERM GOAL #3   Title patient will have pain level max of 5/10 by 06/17/2017 to allow improved function with moving, sitting and walking   Baseline pain level max 10/10 with aggravating activities; intermittent pain with certain movement and tryning to bend knee to full ROM   Status Revised     PT LONG TERM GOAL #4   Title patient will be able to walk without assistive device safely on level surfaces and stairs by 8/2//2018 to improve function with household and community activities   Baseline  01/28/17 walking with one crutch and intermittently no crutches; 04/29/2017 ambulating without AD with limp and wide base of support,    Status Partially Met     PT LONG TERM GOAL #5   Title patient will be independent with home program for pain control, flexibiltiy, strength for right LE by discharge 06/17/17 in order to continue to improve function with self management   Baseline requires assistance and guidance for all exercises, limited knowledge of appropriate pain control strategies, progression of exercises    Status Revised               Plan - 05/25/17 1831    Clinical Impression Statement Improved AAROM right knee to 70 degrees flexion post treatment. Patient demonstrates steady progress towards goals with improvement noted in ROM, strength, endurance. Improved gait pattern demonstrated with more normal base of support following session. Patient will benefit from continued physical therapy intervention to address limitations and achieve goals.    Rehab Potential Good   PT Frequency 2x / week   PT Duration 6 weeks   PT Treatment/Interventions Electrical Stimulation;Gait training;Moist Heat;Therapeutic activities;Therapeutic exercise;Balance training;Neuromuscular re-education;Patient/family education;Passive range of motion;Scar  mobilization;Manual techniques   PT Next Visit Plan modalities for pain control, muscle spasms, manual soft tissue mobilization, therapeutic exercise for ROM, gait   PT Home Exercise Plan continue with SLR, knee flexion, extension, weight shifting and pain control      Patient will benefit from skilled therapeutic intervention in order to improve the following deficits and impairments:  Decreased strength, Decreased balance, Hypomobility, Impaired flexibility, Pain, Impaired perceived functional ability, Decreased activity tolerance, Decreased endurance, Increased muscle spasms, Difficulty walking, Decreased range of motion  Visit Diagnosis: Pain in right leg  Other muscle spasm  Difficulty in walking, not elsewhere classified  Stiffness of right knee, not elsewhere classified  Muscle weakness (generalized)     Problem List There are no active problems to display for this patient.   Jomarie Longs PT 05/26/2017, 8:26 AM  [place] [facility] [address] [place], [address] Phone: [phone]   Fax:  [phone]  Name: Peter Stanley MRN: [id number] Date of Birth: Jul 09, 1981

## 2017-05-27 ENCOUNTER — Ambulatory Visit: Payer: Self-pay | Admitting: Physical Therapy

## 2017-06-01 ENCOUNTER — Ambulatory Visit: Payer: Self-pay | Admitting: Physical Therapy

## 2017-06-01 ENCOUNTER — Encounter: Payer: Self-pay | Admitting: Physical Therapy

## 2017-06-01 DIAGNOSIS — M6281 Muscle weakness (generalized): Secondary | ICD-10-CM

## 2017-06-01 DIAGNOSIS — M25661 Stiffness of right knee, not elsewhere classified: Secondary | ICD-10-CM

## 2017-06-01 DIAGNOSIS — M79604 Pain in right leg: Secondary | ICD-10-CM

## 2017-06-01 DIAGNOSIS — R262 Difficulty in walking, not elsewhere classified: Secondary | ICD-10-CM

## 2017-06-01 DIAGNOSIS — M62838 Other muscle spasm: Secondary | ICD-10-CM

## 2017-06-01 NOTE — Therapy (Signed)
[facility] [address] [place], [address] Phone: [phone]   Fax:  [phone]  Physical Therapy Treatment  Patient Details  Name: Peter Stanley MRN: [id number] Date of Birth: 23-Sep-1981 Referring Provider: Kathe Mariner MD  Encounter Date: 06/01/2017      PT End of Session - 06/01/17 1743    Visit Number 61   Number of Visits 72   Date for PT Re-Evaluation 06/17/17   PT Start Time 7425   PT Stop Time 1805   PT Time Calculation (min) 50 min   Activity Tolerance Patient limited by pain;Patient tolerated treatment well   Behavior During Therapy [facility] for tasks assessed/performed      Past Medical History:  Diagnosis Date  . Allergy   . Anxiety     History reviewed. No pertinent surgical history.  There were no vitals filed for this visit.      Subjective Assessment - 06/01/17 1739    Subjective Patient is able to walk with less difficulty. Patient reports he has been walking in community shopping and this causes significant increased right LE pain and stiffness   Limitations Sitting;Standing;Walking;House hold activities;Other (comment)   Patient Stated Goals to be able to perform personal care activites and return to normal activites without limitaitons (prior level of function)   Currently in Pain? No/denies        Objective: Gait: antalgic pattern with increasedbase of support with right LE out to side, decreasedfrom previous session Palpation: decreased soft tissue elasticity right knee quadriceps and along anterior aspect of knee   Treatment: Manual therapy: 15 min. Goal: improve soft tissue elasticity, pain, ROM STM including myofascial release performed to right LE quadriceps and knee with patient seated at edge of treatment table; tibiofemoral joint mobilization AP glides grade 2-3 as tolerated to improve knee flexion multiple sets, reps in conjunction with AAROM   Therapeutic exercise: goal: improve knee flexion, independent with home program; patient performed with assistance, VC and demonstration of therapist Leg press 65# bilateral 2 x 15 reps, Single leg Leg press 65# x 10 reps each LE with increased effort and pain in right knee with increased flexion Hip rotary machine for hip strengthening: hip extension each LE 130# x 15 reps with guidance, VC and demonstration for correct alignment and technique; increased soreness/pain in right hip with stabilization for left hip exercises, no worse following exercise Hip abduction 55# each LE x 15 reps with fatigue in stance LE Standing single leg stand with one LE on BOSU ball for balance toss 4# ball at trampoline 15 with each LE balancing (non contact guarding for safety) Seated AAROM to improve knee flexion right LE  NuStep x 12 min. Level #5 intensity; independent (unbilled time) for endurance, ROM  Patient response to treatment: Patient demonstrated improved AAROM right knee flexion from 55 to 65 degrees flexion following exercise and STM. Improved soft tissue elasticity by 50% following STM allowing improved flexibility in knee          PT Education - 06/01/17 1817    Education provided Yes   Education Details reinforced ROM exercises and self management of soft tissue to improve ROM   Person(s) Educated Patient   Methods Explanation   Comprehension Verbalized understanding             PT Long Term Goals - 04/29/17 1743      PT LONG TERM GOAL #1   Title Patient will improve AAROM  right knee up to 70 degrees by 06/17/2017 to allow him to sit and walk with more normal gait pattern and to allow decreased pain in knee   Baseline AAROM right knee 0-10 degrees flexion with pain; 10/26/16 0-50 degreed flexion; 65 degrees flextion 11/19/16; 12/31/16 60 degrees flexion 02/11/17 0-50; 03/18/17 0-60 ;04/29/17 0-60   Status Revised     PT LONG TERM GOAL #2   Title patient will improve LEFS to 50/80 by  06/17/17 to demonstrate improving function with daily activities    Baseline LEFS 0/80; 10/26/16 deferred; 11/19/16 25/80; 02/11/17 30/80; 03/18/2017 40/80; 04/29/17 to be assessed   Status Revised     PT LONG TERM GOAL #3   Title patient will have pain level max of 5/10 by 06/17/2017 to allow improved function with moving, sitting and walking   Baseline pain level max 10/10 with aggravating activities; intermittent pain with certain movement and tryning to bend knee to full ROM   Status Revised     PT LONG TERM GOAL #4   Title patient will be able to walk without assistive device safely on level surfaces and stairs by 8/2//2018 to improve function with household and community activities   Baseline  01/28/17 walking with one crutch and intermittently no crutches; 04/29/2017 ambulating without AD with limp and wide base of support,    Status Partially Met     PT LONG TERM GOAL #5   Title patient will be independent with home program for pain control, flexibiltiy, strength for right LE by discharge 06/17/17 in order to continue to improve function with self management   Baseline requires assistance and guidance for all exercises, limited knowledge of appropriate pain control strategies, progression of exercises    Status Revised               Plan - 06/01/17 1746    Clinical Impression Statement Improving strength and ROM knee flexion right knee. Patient demonstrates steady progress towards goals with improvement noted in ROM, strength, endurance. He continues with significant decreased ROM in right knee and decreased strength. Patient will benefit from continued physical therapy intervention to address limitations and achieve goals.    Rehab Potential Good   PT Frequency 2x / week   PT Duration 6 weeks   PT Treatment/Interventions Electrical Stimulation;Gait training;Moist Heat;Therapeutic activities;Therapeutic exercise;Balance training;Neuromuscular re-education;Patient/family education;Passive  range of motion;Scar mobilization;Manual techniques   PT Next Visit Plan modalities for pain control, muscle spasms, manual soft tissue mobilization, therapeutic exercise for ROM, gait   PT Home Exercise Plan continue with SLR, knee flexion, extension, weight shifting and pain control      Patient will benefit from skilled therapeutic intervention in order to improve the following deficits and impairments:  Decreased strength, Decreased balance, Hypomobility, Impaired flexibility, Pain, Impaired perceived functional ability, Decreased activity tolerance, Decreased endurance, Increased muscle spasms, Difficulty walking, Decreased range of motion  Visit Diagnosis: Pain in right leg  Other muscle spasm  Difficulty in walking, not elsewhere classified  Stiffness of right knee, not elsewhere classified  Muscle weakness (generalized)     Problem List There are no active problems to display for this patient.   Jomarie Longs PT 06/01/2017, 6:18 PM  [facility] [facility] [address], [place], [address] Phone: [phone]   Fax:  [phone]  Name: Peter Stanley MRN: [id number] Date of Birth: [birth year]

## 2017-06-03 ENCOUNTER — Ambulatory Visit: Payer: Self-pay | Admitting: Physical Therapy

## 2017-06-08 ENCOUNTER — Ambulatory Visit: Payer: Self-pay | Admitting: Physical Therapy

## 2017-06-08 ENCOUNTER — Encounter: Payer: Self-pay | Admitting: Physical Therapy

## 2017-06-08 DIAGNOSIS — M79604 Pain in right leg: Secondary | ICD-10-CM

## 2017-06-08 DIAGNOSIS — M62838 Other muscle spasm: Secondary | ICD-10-CM

## 2017-06-08 DIAGNOSIS — M25661 Stiffness of right knee, not elsewhere classified: Secondary | ICD-10-CM

## 2017-06-08 DIAGNOSIS — M6281 Muscle weakness (generalized): Secondary | ICD-10-CM

## 2017-06-08 DIAGNOSIS — R262 Difficulty in walking, not elsewhere classified: Secondary | ICD-10-CM

## 2017-06-08 NOTE — Therapy (Signed)
[facility] [address] [place], [address] Phone: [phone]   Fax:  [phone]  Physical Therapy Treatment  Patient Details  Name: Peter Stanley MRN: [id number] Date of Birth: 08/06/1981 Referring Provider: Kathe Mariner MD  Encounter Date: 06/08/2017      PT End of Session - 06/08/17 1810    Visit Number 62   Number of Visits 72   Date for PT Re-Evaluation 06/17/17   PT Start Time 1720   PT Stop Time 1800   PT Time Calculation (min) 40 min   Activity Tolerance Patient limited by pain;Patient tolerated treatment well   Behavior During Therapy [facility] for tasks assessed/performed      Past Medical History:  Diagnosis Date  . Allergy   . Anxiety     History reviewed. No pertinent surgical history.  There were no vitals filed for this visit.      Subjective Assessment - 06/08/17 1809    Subjective Patient is able to walk with less difficulty. Patient reports he has been walking in community shopping,    Limitations Sitting;Standing;Walking;House hold activities;Other (comment)   Patient Stated Goals to be able to perform personal care activites and return to normal activites without limitaitons (prior level of function)   Currently in Pain? No/denies        Objective: Gait: antalgic pattern with increasedbase of support with right LE out to side, able to correct with VC Palpation: decreased soft tissue elasticity right knee quadriceps and along anterior aspect of knee and medial hamstring and calf muscles AAROM right knee flexion 0-65 degrees seated at edge of treatment table   Treatment: Manual therapy:15 min. Goal: improve soft tissue elasticity, pain, ROM STM including myofascial release performed to right LE quadriceps and knee with patient seated at edge of treatment table;STM to hamstring and calf muscles with patient prone lying  Therapeutic exercise: goal: improve knee  flexion, independent with home program; patient performed with assistance, VC and demonstration of therapist Multiple reps of stretching with patient prone lying for quadriceps stretch and to improve flexion of right knee, 30 second to 90 second holds at end range flexion: patient reported decreased pain in hip and increased stretching felt in quadriceps muscle belly with repetition Leg press 65# bilateral 2 x 15 reps, Single leg Leg press 65# x 10 reps each LE with increased effort and pain in right knee with increased flexion Hip rotary machine for hip strengthening: hip extension each LE 130# x 15 reps with guidance, VC and demonstration for correct alignment and technique; increased soreness/pain in right hip with stabilization for left hip exercises, no worse following exercise Hip abduction 70# each LE x 15 reps with fatigue in stance LE Standing single leg stand with one LE on BOSU ball for balance toss 4# ball at trampoline 20 with each LE balancing (close supervision for safety) Seated AAROM to improve knee flexion right LE  NuStep x 12 min. Level #5intensity; independent (unbilled time) for endurance, ROM  Patient response to treatment: Patient demonstrated improved  soft tissue elasticity by 50% following STM allowing improved flexibility in knee with prone knee flexion. Patient required minimal assistance and verbal cuing to perform hip exercises and leg press.           PT Education - 06/08/17 1809    Education provided Yes   Education Details instruction in exercises, quad stretching prone lying   Person(s) Educated Patient   Methods Explanation;Demonstration;Verbal  cues   Comprehension Verbalized understanding             PT Long Term Goals - 04/29/17 1743      PT LONG TERM GOAL #1   Title Patient will improve AAROM right knee up to 70 degrees by 06/17/2017 to allow him to sit and walk with more normal gait pattern and to allow decreased pain in knee   Baseline  AAROM right knee 0-10 degrees flexion with pain; 10/26/16 0-50 degreed flexion; 65 degrees flextion 11/19/16; 12/31/16 60 degrees flexion 02/11/17 0-50; 03/18/17 0-60 ;04/29/17 0-60   Status Revised     PT LONG TERM GOAL #2   Title patient will improve LEFS to 50/80 by 06/17/17 to demonstrate improving function with daily activities    Baseline LEFS 0/80; 10/26/16 deferred; 11/19/16 25/80; 02/11/17 30/80; 03/18/2017 40/80; 04/29/17 to be assessed   Status Revised     PT LONG TERM GOAL #3   Title patient will have pain level max of 5/10 by 06/17/2017 to allow improved function with moving, sitting and walking   Baseline pain level max 10/10 with aggravating activities; intermittent pain with certain movement and tryning to bend knee to full ROM   Status Revised     PT LONG TERM GOAL #4   Title patient will be able to walk without assistive device safely on level surfaces and stairs by 8/2//2018 to improve function with household and community activities   Baseline  01/28/17 walking with one crutch and intermittently no crutches; 04/29/2017 ambulating without AD with limp and wide base of support,    Status Partially Met     PT LONG TERM GOAL #5   Title patient will be independent with home program for pain control, flexibiltiy, strength for right LE by discharge 06/17/17 in order to continue to improve function with self management   Baseline requires assistance and guidance for all exercises, limited knowledge of appropriate pain control strategies, progression of exercises    Status Revised               Plan - 06/08/17 1810    Clinical Impression Statement Patient demonstrated improvement with soft tissue elasticity, decreased spasms in hamstring muscle and improved quadriceps flexibility at end of session. patient continues with stiffness in right knee as he heals from severe fracture of femur with extended healing and will benefit from continued physical therapy intervention to achieve goals.     Rehab Potential Good   PT Frequency 2x / week   PT Duration 6 weeks   PT Treatment/Interventions Electrical Stimulation;Gait training;Moist Heat;Therapeutic activities;Therapeutic exercise;Balance training;Neuromuscular re-education;Patient/family education;Passive range of motion;Scar mobilization;Manual techniques   PT Next Visit Plan modalities for pain control, muscle spasms, manual soft tissue mobilization, therapeutic exercise for ROM, gait   PT Home Exercise Plan continue with SLR, knee flexion, extension, weight shifting and pain control      Patient will benefit from skilled therapeutic intervention in order to improve the following deficits and impairments:  Decreased strength, Decreased balance, Hypomobility, Impaired flexibility, Pain, Impaired perceived functional ability, Decreased activity tolerance, Decreased endurance, Increased muscle spasms, Difficulty walking, Decreased range of motion  Visit Diagnosis: Pain in right leg  Other muscle spasm  Stiffness of right knee, not elsewhere classified  Muscle weakness (generalized)  Difficulty in walking, not elsewhere classified     Problem List There are no active problems to display for this patient.   Aldona Lento 06/08/2017, 6:13 PM  [facility] [address] [place], [address] Phone: [phone]   Fax:  [phone]  Name: Peter Stanley MRN: [id number] Date of Birth: 24-Oct-1981

## 2017-06-10 ENCOUNTER — Encounter: Payer: Self-pay | Admitting: Physical Therapy

## 2017-06-10 ENCOUNTER — Ambulatory Visit: Payer: Self-pay | Admitting: Physical Therapy

## 2017-06-10 DIAGNOSIS — M25661 Stiffness of right knee, not elsewhere classified: Secondary | ICD-10-CM

## 2017-06-10 DIAGNOSIS — M6281 Muscle weakness (generalized): Secondary | ICD-10-CM

## 2017-06-10 DIAGNOSIS — M79604 Pain in right leg: Secondary | ICD-10-CM

## 2017-06-11 NOTE — Therapy (Signed)
[facility] [address] [place], [address] Phone: [phone]   Fax:  [phone]  Physical Therapy Treatment  Patient Details  Name: Peter Stanley MRN: [id number] Date of Birth: [birth year] Referring Provider: Kathe Mariner MD  Encounter Date: 06/10/2017      PT End of Session - 06/10/17 1724    Visit Number 63   Number of Visits 72   Date for PT Re-Evaluation 06/17/17   PT Start Time 1706   PT Stop Time 1761   PT Time Calculation (min) 48 min   Activity Tolerance Patient limited by pain;Patient tolerated treatment well   Behavior During Therapy [facility] for tasks assessed/performed      Past Medical History:  Diagnosis Date  . Allergy   . Anxiety     History reviewed. No pertinent surgical history.  There were no vitals filed for this visit.      Subjective Assessment - 06/10/17 1718    Subjective Patient reports his right knee feels stiff today with limitations in ability to bend knee with tightness felt on inside of knee. Overall he reports feeling stronger with walking and daily activities.    Limitations Sitting;Standing;Walking;House hold activities;Other (comment)   Patient Stated Goals to be able to perform personal care activites and return to normal activites without limitaitons (prior level of function)   Currently in Pain? Other (Comment)  stiffness in right knee, medial aspect        Objective:  Gait: antalgic pattern with increasedbase of support with right LE out to side, able to correct with VC and effort   Treatment: Therapeutic exercise: goal: improve knee flexion, independent with home program; patient performed with assistance, VC and demonstration of therapist Leg press 65# bilateral 3 x 15 reps Single leg Leg press 65# x 10 reps each LE with increased effort and pain in right knee with increased flexion Hip rotary machine for hip strengthening: hip extension each  LE 130#  3 x 10 reps with guidance, VC and demonstration for correct alignment and technique; increased soreness/pain in right hip with stabilization for left hip exercises, no worse following exercise Hip abduction 70# each LE x 15 reps with fatigue in stance LE Standing single leg stand with one LE on BOSU ball for balance toss 4# ball at trampoline 20 with each LE balancing (close supervision for safety) Stretching on stairs for improving knee flexion 3 reps x 20-30 seconds with instruction to perform at home 2x/day  NuStep x 49mn. Level #5intensity; independent (unbilled time) for endurance, ROM pre exercise  Patient response to treatment: Patient demonstrated improved flexibility in right knee with repeated motion on leg press. He did not tolerate NuStep well due to incresaed right knee pain medially. He required cuing and demonstration to perform exercises with correct technique, intensity and good hip and knee alignment.          PT Education - 06/10/17 1723    Education provided Yes   Education Details instruction in exercises, technique, stair stretching   Person(s) Educated Patient   Methods Explanation;Demonstration;Verbal cues   Comprehension Verbalized understanding;Returned demonstration;Verbal cues required             PT Long Term Goals - 04/29/17 1743      PT LONG TERM GOAL #1   Title Patient will improve AAROM right knee up to 70 degrees by 06/17/2017 to allow him to sit and walk with more normal gait pattern and to  allow decreased pain in knee   Baseline AAROM right knee 0-10 degrees flexion with pain; 10/26/16 0-50 degreed flexion; 65 degrees flextion 11/19/16; 12/31/16 60 degrees flexion 02/11/17 0-50; 03/18/17 0-60 ;04/29/17 0-60   Status Revised     PT LONG TERM GOAL #2   Title patient will improve LEFS to 50/80 by 06/17/17 to demonstrate improving function with daily activities    Baseline LEFS 0/80; 10/26/16 deferred; 11/19/16 25/80; 02/11/17 30/80; 03/18/2017  40/80; 04/29/17 to be assessed   Status Revised     PT LONG TERM GOAL #3   Title patient will have pain level max of 5/10 by 06/17/2017 to allow improved function with moving, sitting and walking   Baseline pain level max 10/10 with aggravating activities; intermittent pain with certain movement and tryning to bend knee to full ROM   Status Revised     PT LONG TERM GOAL #4   Title patient will be able to walk without assistive device safely on level surfaces and stairs by 8/2//2018 to improve function with household and community activities   Baseline  01/28/17 walking with one crutch and intermittently no crutches; 04/29/2017 ambulating without AD with limp and wide base of support,    Status Partially Met     PT LONG TERM GOAL #5   Title patient will be independent with home program for pain control, flexibiltiy, strength for right LE by discharge 06/17/17 in order to continue to improve function with self management   Baseline requires assistance and guidance for all exercises, limited knowledge of appropriate pain control strategies, progression of exercises    Status Revised               Plan - 06/10/17 1726    Clinical Impression Statement Patient demonstrates improvement in ability to ambulate with less difficulty and is improving ability to flex knee with improved flexibility. He continues with stiffness and pain in right knee with decreased endurance for ambulation and daily activties and will benefit from additional physical therapy intervention to address limitations and achieve goals.    Rehab Potential Good   PT Frequency 2x / week   PT Duration 6 weeks   PT Treatment/Interventions Electrical Stimulation;Gait training;Moist Heat;Therapeutic activities;Therapeutic exercise;Balance training;Neuromuscular re-education;Patient/family education;Passive range of motion;Scar mobilization;Manual techniques   PT Next Visit Plan modalities for pain control, muscle spasms, manual soft  tissue mobilization, therapeutic exercise for ROM, gait   PT Home Exercise Plan continue with SLR, knee flexion, extension, weight shifting and pain control      Patient will benefit from skilled therapeutic intervention in order to improve the following deficits and impairments:  Decreased strength, Decreased balance, Hypomobility, Impaired flexibility, Pain, Impaired perceived functional ability, Decreased activity tolerance, Decreased endurance, Increased muscle spasms, Difficulty walking, Decreased range of motion  Visit Diagnosis: Stiffness of right knee, not elsewhere classified  Muscle weakness (generalized)  Pain in right leg     Problem List There are no active problems to display for this patient.   Jomarie Longs PT 06/11/2017, 2:41 PM  [place] [facility] [address], [place], [address] Phone: [phone]   Fax:  [phone]  Name: Peter Stanley MRN: [id number] Date of Birth: September 29, 1981

## 2017-06-15 ENCOUNTER — Encounter: Payer: Self-pay | Admitting: Physical Therapy

## 2017-06-15 ENCOUNTER — Ambulatory Visit: Payer: Self-pay | Admitting: Physical Therapy

## 2017-06-15 DIAGNOSIS — M62838 Other muscle spasm: Secondary | ICD-10-CM

## 2017-06-15 DIAGNOSIS — R262 Difficulty in walking, not elsewhere classified: Secondary | ICD-10-CM

## 2017-06-15 DIAGNOSIS — M25661 Stiffness of right knee, not elsewhere classified: Secondary | ICD-10-CM

## 2017-06-15 DIAGNOSIS — M79604 Pain in right leg: Secondary | ICD-10-CM

## 2017-06-15 DIAGNOSIS — M6281 Muscle weakness (generalized): Secondary | ICD-10-CM
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
## 2017-06-22 ENCOUNTER — Encounter: Payer: Self-pay | Admitting: Physical Therapy

## 2017-06-22 ENCOUNTER — Ambulatory Visit: Payer: Self-pay | Attending: Orthopedic Surgery | Admitting: Physical Therapy

## 2017-06-22 DIAGNOSIS — M25661 Stiffness of right knee, not elsewhere classified: Secondary | ICD-10-CM | POA: Insufficient documentation

## 2017-06-22 DIAGNOSIS — M62838 Other muscle spasm: Secondary | ICD-10-CM | POA: Insufficient documentation

## 2017-06-22 DIAGNOSIS — M6281 Muscle weakness (generalized): Secondary | ICD-10-CM | POA: Insufficient documentation

## 2017-06-22 DIAGNOSIS — R262 Difficulty in walking, not elsewhere classified: Secondary | ICD-10-CM | POA: Insufficient documentation
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
## 2017-06-24 ENCOUNTER — Ambulatory Visit: Payer: Self-pay | Admitting: Physical Therapy

## 2017-06-24 DIAGNOSIS — M6281 Muscle weakness (generalized): Secondary | ICD-10-CM

## 2017-06-24 DIAGNOSIS — M25661 Stiffness of right knee, not elsewhere classified: Secondary | ICD-10-CM

## 2017-06-24 DIAGNOSIS — R262 Difficulty in walking, not elsewhere classified: Secondary | ICD-10-CM
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
## 2017-06-29 ENCOUNTER — Ambulatory Visit: Payer: Self-pay | Admitting: Physical Therapy

## 2017-06-30 ENCOUNTER — Encounter: Payer: Self-pay | Admitting: Physical Therapy

## 2017-06-30 ENCOUNTER — Ambulatory Visit: Payer: Self-pay | Admitting: Physical Therapy

## 2017-06-30 DIAGNOSIS — M62838 Other muscle spasm: Secondary | ICD-10-CM

## 2017-06-30 DIAGNOSIS — R262 Difficulty in walking, not elsewhere classified: Secondary | ICD-10-CM

## 2017-06-30 DIAGNOSIS — M25661 Stiffness of right knee, not elsewhere classified: Secondary | ICD-10-CM

## 2017-06-30 DIAGNOSIS — M6281 Muscle weakness (generalized): Secondary | ICD-10-CM
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
## 2017-07-01 ENCOUNTER — Ambulatory Visit: Payer: Self-pay | Admitting: Physical Therapy

## 2017-07-05 ENCOUNTER — Ambulatory Visit: Payer: Self-pay | Admitting: Physical Therapy

## 2017-07-06 ENCOUNTER — Encounter: Admitting: Physical Therapy

## 2017-07-07 ENCOUNTER — Ambulatory Visit: Payer: Self-pay | Admitting: Physical Therapy

## 2017-07-07 DIAGNOSIS — M6281 Muscle weakness (generalized): Secondary | ICD-10-CM

## 2017-07-07 DIAGNOSIS — M25661 Stiffness of right knee, not elsewhere classified: Secondary | ICD-10-CM

## 2017-07-07 DIAGNOSIS — R262 Difficulty in walking, not elsewhere classified: Secondary | ICD-10-CM
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
## 2017-07-13 ENCOUNTER — Ambulatory Visit: Payer: Self-pay | Admitting: Physical Therapy

## 2017-07-15 ENCOUNTER — Ambulatory Visit: Payer: Self-pay | Admitting: Physical Therapy

## 2017-07-15 ENCOUNTER — Encounter: Payer: Self-pay | Admitting: Physical Therapy

## 2017-07-15 DIAGNOSIS — M25661 Stiffness of right knee, not elsewhere classified: Secondary | ICD-10-CM

## 2017-07-15 DIAGNOSIS — M6281 Muscle weakness (generalized): Secondary | ICD-10-CM

## 2017-07-15 DIAGNOSIS — R262 Difficulty in walking, not elsewhere classified: Secondary | ICD-10-CM

## 2017-07-15 DIAGNOSIS — M62838 Other muscle spasm: Secondary | ICD-10-CM
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
## 2017-07-20 ENCOUNTER — Encounter: Payer: Self-pay | Admitting: Physical Therapy

## 2017-07-20 ENCOUNTER — Ambulatory Visit: Payer: Self-pay | Attending: Orthopedic Surgery | Admitting: Physical Therapy

## 2017-07-20 DIAGNOSIS — M62838 Other muscle spasm: Secondary | ICD-10-CM | POA: Insufficient documentation

## 2017-07-20 DIAGNOSIS — M79604 Pain in right leg: Secondary | ICD-10-CM | POA: Insufficient documentation

## 2017-07-20 DIAGNOSIS — M6281 Muscle weakness (generalized): Secondary | ICD-10-CM | POA: Insufficient documentation

## 2017-07-20 DIAGNOSIS — R262 Difficulty in walking, not elsewhere classified: Secondary | ICD-10-CM | POA: Insufficient documentation

## 2017-07-20 DIAGNOSIS — M25661 Stiffness of right knee, not elsewhere classified: Secondary | ICD-10-CM | POA: Insufficient documentation
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
## 2017-07-22 ENCOUNTER — Ambulatory Visit: Payer: Self-pay | Admitting: Physical Therapy

## 2017-07-26 ENCOUNTER — Encounter: Payer: Self-pay | Admitting: Physical Therapy

## 2017-07-26 ENCOUNTER — Ambulatory Visit: Payer: Self-pay | Admitting: Physical Therapy

## 2017-07-26 DIAGNOSIS — M62838 Other muscle spasm: Secondary | ICD-10-CM

## 2017-07-26 DIAGNOSIS — M79604 Pain in right leg: Secondary | ICD-10-CM

## 2017-07-26 DIAGNOSIS — M25661 Stiffness of right knee, not elsewhere classified: Secondary | ICD-10-CM

## 2017-07-26 DIAGNOSIS — M6281 Muscle weakness (generalized): Secondary | ICD-10-CM

## 2017-07-26 DIAGNOSIS — R262 Difficulty in walking, not elsewhere classified: Secondary | ICD-10-CM
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
## 2017-07-27 ENCOUNTER — Ambulatory Visit: Payer: Self-pay | Admitting: Physical Therapy

## 2017-07-28 ENCOUNTER — Ambulatory Visit: Payer: Self-pay | Admitting: Physical Therapy

## 2017-07-29 ENCOUNTER — Ambulatory Visit: Payer: Self-pay | Admitting: Physical Therapy

## 2017-08-03 ENCOUNTER — Ambulatory Visit: Payer: Self-pay | Admitting: Physical Therapy

## 2017-08-03 ENCOUNTER — Encounter: Payer: Self-pay | Admitting: Physical Therapy

## 2017-08-03 DIAGNOSIS — R262 Difficulty in walking, not elsewhere classified: Secondary | ICD-10-CM

## 2017-08-03 DIAGNOSIS — M62838 Other muscle spasm: Secondary | ICD-10-CM

## 2017-08-03 DIAGNOSIS — M6281 Muscle weakness (generalized): Secondary | ICD-10-CM

## 2017-08-03 DIAGNOSIS — M25661 Stiffness of right knee, not elsewhere classified: Secondary | ICD-10-CM
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
## 2017-08-05 ENCOUNTER — Ambulatory Visit: Payer: Self-pay | Admitting: Physical Therapy

## 2017-08-05 DIAGNOSIS — R262 Difficulty in walking, not elsewhere classified: Secondary | ICD-10-CM

## 2017-08-05 DIAGNOSIS — M6281 Muscle weakness (generalized): Secondary | ICD-10-CM

## 2017-08-05 DIAGNOSIS — M25661 Stiffness of right knee, not elsewhere classified: Secondary | ICD-10-CM

## 2017-08-05 DIAGNOSIS — M62838 Other muscle spasm: Secondary | ICD-10-CM
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
## 2017-08-09 ENCOUNTER — Encounter: Payer: Self-pay | Admitting: Physical Therapy

## 2017-08-09 ENCOUNTER — Ambulatory Visit: Payer: Self-pay | Admitting: Physical Therapy

## 2017-08-09 DIAGNOSIS — R262 Difficulty in walking, not elsewhere classified: Secondary | ICD-10-CM

## 2017-08-09 DIAGNOSIS — M6281 Muscle weakness (generalized): Secondary | ICD-10-CM

## 2017-08-09 DIAGNOSIS — M25661 Stiffness of right knee, not elsewhere classified: Secondary | ICD-10-CM

## 2017-08-09 DIAGNOSIS — M62838 Other muscle spasm: Secondary | ICD-10-CM
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
## 2017-08-10 ENCOUNTER — Ambulatory Visit: Payer: Self-pay | Admitting: Physical Therapy

## 2017-08-12 ENCOUNTER — Ambulatory Visit: Payer: Self-pay | Admitting: Physical Therapy

## 2017-08-12 DIAGNOSIS — M6281 Muscle weakness (generalized): Secondary | ICD-10-CM

## 2017-08-12 DIAGNOSIS — R262 Difficulty in walking, not elsewhere classified: Secondary | ICD-10-CM

## 2017-08-12 DIAGNOSIS — M62838 Other muscle spasm: Secondary | ICD-10-CM

## 2017-08-12 DIAGNOSIS — M25661 Stiffness of right knee, not elsewhere classified: Secondary | ICD-10-CM
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
## 2018-01-28 ENCOUNTER — Other Ambulatory Visit: Payer: Self-pay

## 2018-01-28 ENCOUNTER — Encounter: Payer: Self-pay | Admitting: Emergency Medicine

## 2018-01-28 ENCOUNTER — Emergency Department
Admission: EM | Admit: 2018-01-28 | Discharge: 2018-01-28 | Disposition: A | Discharge status: Home / Self Care | Payer: Self-pay | Attending: Emergency Medicine | Admitting: Emergency Medicine

## 2018-01-28 DIAGNOSIS — F17210 Nicotine dependence, cigarettes, uncomplicated: Secondary | ICD-10-CM | POA: Insufficient documentation

## 2018-01-28 DIAGNOSIS — J069 Acute upper respiratory infection, unspecified: Secondary | ICD-10-CM

## 2018-01-28 LAB — INFLUENZA PANEL BY PCR (TYPE A & B)
INFLAPCR: NEGATIVE
Influenza B By PCR: NEGATIVE

## 2018-01-28 MED ORDER — AZITHROMYCIN 250 MG PO TABS: ORAL_TABLET | ORAL | 0 refills | Status: DC

## 2018-01-28 MED ORDER — BENZONATATE 200 MG PO CAPS: 200.0000 mg | ORAL_CAPSULE | Freq: Three times a day (TID) | ORAL | 0 refills | Status: DC | PRN

## 2018-01-28 NOTE — ED Triage Notes (Signed)
Pt to ED via POV c/o flu like symptoms for the past 3 days. Pt states that he has been feeling hot, having nasal congestion, non-productive cough and body aches. Pt in NAD at this time.

## 2018-01-28 NOTE — ED Notes (Signed)
See triage note  Presents with generalized body aches and subjective fever for 3 days   Low grade fever noted on arrival

## 2018-01-28 NOTE — ED Provider Notes (Signed)
[facility] Emergency Department Provider Note  ____________________________________________   First MD Initiated Contact with Patient 01/28/18 1557     (approximate)  I have reviewed the triage vital signs and the nursing notes.   HISTORY  Chief Complaint Generalized Body Aches    HPI Peter Stanley is a 37 y.o. male presents emergency department complaining of body aches for 3 days.  He states he had the symptoms Tuesday he got better on Wednesday and symptoms returned yesterday with worsening symptoms this morning.  He states he has had a dry cough.  He feels like his been hit by a truck.  He states his throat is only sore with cough.  He does have nasal congestion and left ear pain.  He denies any vomiting or diarrhea.  He states he is otherwise healthy.  Past Medical History:  Diagnosis Date  . Allergy   . Anxiety     There are no active problems to display for this patient.   History reviewed. No pertinent surgical history.  Prior to Admission medications   Medication Sig Start Date End Date Taking? Authorizing Provider  azithromycin (ZITHROMAX Z-PAK) 250 MG tablet 2 pills today then 1 pill a day for 4 days 01/28/18   Sherrie MustacheFisher, Roselyn BeringSusan W, PA-C  benzonatate (TESSALON) 200 MG capsule Take 1 capsule (200 mg total) by mouth 3 (three) times daily as needed for cough. 01/28/18   Faythe GheeFisher, Meghin Thivierge W, PA-C    Allergies Vicodin [hydrocodone-acetaminophen]  No family history on file.  Social History Social History   Tobacco Use  . Smoking status: Current Every Day Smoker    Packs/day: 1.00    Types: Cigarettes  . Smokeless tobacco: Never Used  Substance Use Topics  . Alcohol use: No  . Drug use: Not on file    Review of Systems  Constitutional: Positive fever/chills Eyes: No visual changes. ENT: No sore throat.  Positive for left ear pain and sinus congestion Respiratory: Positive cough Genitourinary: Negative for  dysuria. Musculoskeletal: Negative for back pain. Skin: Negative for rash.    ____________________________________________   PHYSICAL EXAM:  VITAL SIGNS: ED Triage Vitals  Enc Vitals Group     BP 01/28/18 1529 131/76     Pulse Rate 01/28/18 1529 84     Resp 01/28/18 1529 16     Temp 01/28/18 1529 99 F (37.2 C)     Temp Source 01/28/18 1529 Oral     SpO2 01/28/18 1529 94 %     Weight 01/28/18 1530 259 lb (117.5 kg)     Height 01/28/18 1530 6' (1.829 m)     Head Circumference --      Peak Flow --      Pain Score 01/28/18 1530 8     Pain Loc --      Pain Edu? --      Excl. in GC? --     Constitutional: Alert and oriented. Well appearing and in no acute distress. Eyes: Conjunctivae are normal.  Head: Atraumatic. Ears: The left TM is pink, right TM is clear Nose: No congestion/rhinnorhea. Mouth/Throat: Mucous membranes are moist.  Throat appears normal Neck: Is supple, no lymphadenopathy is noted Cardiovascular: Normal rate, regular rhythm.  Heart sounds are normal Respiratory: Normal respiratory effort.  No retractions, lungs are clear to auscultation, positive for dry cough GU: deferred Musculoskeletal: FROM all extremities, warm and well perfused Neurologic:  Normal speech and language.  Skin:  Skin is warm, dry and intact. No  rash noted. Psychiatric: Mood and affect are normal. Speech and behavior are normal.  ____________________________________________   LABS (all labs ordered are listed, but only abnormal results are displayed)  Labs Reviewed  INFLUENZA PANEL BY PCR (TYPE A & B)   ____________________________________________   ____________________________________________  RADIOLOGY    ____________________________________________   PROCEDURES  Procedure(s) performed: No  Procedures    ____________________________________________   INITIAL IMPRESSION / ASSESSMENT AND PLAN / ED COURSE  Pertinent labs & imaging results that were available  during my care of the patient were reviewed by me and considered in my medical decision making (see chart for details).  Patient is 37 year old male complaining of flulike symptoms.  On physical exam he appears to not feel well.  He has low-grade fever.  The left TM is a little pink.  Has dry cough.  Lungs clear to auscultation.  Remainder the exam of the exam is negative  Flu test ordered  ----------------------------------------- 5:17 PM on 01/28/2018 -----------------------------------------  Flu test is negative.  Flu test results were discussed with patient.  He was given a prescription for Z-Pak and Tessalon Perles.  Take over-the-counter cold medicines as needed.  If he is worsening he should follow-up with either his regular doctor or return to the emergency department.  He was given a work note.  He is to drink plenty of fluids.  Take Tylenol and ibuprofen.  He states he understands and was discharged in stable condition  As part of my medical decision making, I reviewed the following data within the electronic MEDICAL RECORD NUMBER Nursing notes reviewed and incorporated, Labs reviewed influenza test is negative, Notes from prior ED visits and [place] Controlled Substance Database  ____________________________________________   FINAL CLINICAL IMPRESSION(S) / ED DIAGNOSES  Final diagnoses:  Acute URI      NEW MEDICATIONS STARTED DURING THIS VISIT:  New Prescriptions   AZITHROMYCIN (ZITHROMAX Z-PAK) 250 MG TABLET    2 pills today then 1 pill a day for 4 days   BENZONATATE (TESSALON) 200 MG CAPSULE    Take 1 capsule (200 mg total) by mouth 3 (three) times daily as needed for cough.     Note:  This document was prepared using Dragon voice recognition software and may include unintentional dictation errors.    Faythe Ghee, PA-C 01/28/18 1719    Sharman Cheek, MD 01/29/18 2351

## 2018-01-28 NOTE — Discharge Instructions (Signed)
Follow-up with your regular doctor if not better in 3-5 days.  Return to emergency department if worsening.  Take medication as prescribed.  Drink plenty fluids.  Take Tylenol and ibuprofen as needed for fever or pain.  Over-the-counter cold medicines would also be appropriate.  Take Mucinex or any other cough medicine of choice.

## 2018-02-02 ENCOUNTER — Emergency Department
Admission: EM | Admit: 2018-02-02 | Discharge: 2018-02-02 | Disposition: A | Discharge status: Home / Self Care | Payer: Self-pay | Attending: Emergency Medicine | Admitting: Emergency Medicine

## 2018-02-02 ENCOUNTER — Encounter: Payer: Self-pay | Admitting: Emergency Medicine

## 2018-02-02 ENCOUNTER — Other Ambulatory Visit: Payer: Self-pay

## 2018-02-02 DIAGNOSIS — J069 Acute upper respiratory infection, unspecified: Secondary | ICD-10-CM | POA: Insufficient documentation

## 2018-02-02 DIAGNOSIS — F17210 Nicotine dependence, cigarettes, uncomplicated: Secondary | ICD-10-CM | POA: Insufficient documentation

## 2018-02-02 DIAGNOSIS — H6692 Otitis media, unspecified, left ear: Secondary | ICD-10-CM | POA: Insufficient documentation

## 2018-02-02 DIAGNOSIS — H6690 Otitis media, unspecified, unspecified ear: Secondary | ICD-10-CM

## 2018-02-02 MED ORDER — AMOXICILLIN-POT CLAVULANATE 875-125 MG PO TABS: 1.0000 | ORAL_TABLET | Freq: Two times a day (BID) | ORAL | 0 refills | Status: AC

## 2018-02-02 MED ORDER — NEOMYCIN-POLYMYXIN-HC 3.5-10000-1 OT SOLN: 4.0000 [drp] | Freq: Four times a day (QID) | OTIC | 0 refills | Status: AC

## 2018-02-02 NOTE — ED Provider Notes (Signed)
[facility] Emergency Department Provider Note  ____________________________________________  Time seen: Approximately 7:25 AM  I have reviewed the triage vital signs and the nursing notes.   HISTORY  Chief Complaint Otalgia    HPI Peter Stanley is a 37 y.o. male that presents to emergency department for evaluation of left ear pain for 1 week.  This feels similar to swimmer's ear that he has had in the past.  Patient states that he is getting over an upper respiratory infection.  He does still have some nasal congestion.  He is seen in the emergency department 5 days ago and was given a prescription for antibiotics but was unable to afford to fill the prescription for medication.  He denies fever, chills, cough, shortness of breath, chest pain.   Past Medical History:  Diagnosis Date  . Allergy   . Anxiety     There are no active problems to display for this patient.   History reviewed. No pertinent surgical history.  Prior to Admission medications   Medication Sig Start Date End Date Taking? Authorizing Provider  amoxicillin-clavulanate (AUGMENTIN) 875-125 MG tablet Take 1 tablet by mouth 2 (two) times daily for 10 days. 02/02/18 02/12/18  Enid Derry, PA-C  azithromycin (ZITHROMAX Z-PAK) 250 MG tablet 2 pills today then 1 pill a day for 4 days 01/28/18   Sherrie Mustache Roselyn Bering, PA-C  benzonatate (TESSALON) 200 MG capsule Take 1 capsule (200 mg total) by mouth 3 (three) times daily as needed for cough. 01/28/18   Fisher, Roselyn Bering, PA-C  neomycin-polymyxin-hydrocortisone (CORTISPORIN) OTIC solution Place 4 drops into the left ear 4 (four) times daily for 10 days. 02/02/18 02/12/18  Enid Derry, PA-C    Allergies Vicodin [hydrocodone-acetaminophen]  No family history on file.  Social History Social History   Tobacco Use  . Smoking status: Current Every Day Smoker    Packs/day: 1.00    Types: Cigarettes  . Smokeless tobacco: Never Used  Substance  Use Topics  . Alcohol use: No  . Drug use: Not on file     Review of Systems  Constitutional: No fever/chills Eyes: No visual changes. No discharge. ENT: Positive for congestion and rhinorrhea. Respiratory: No SOB. Gastrointestinal: No abdominal pain.  Musculoskeletal: Negative for musculoskeletal pain. Skin: Negative for rash, abrasions, lacerations, ecchymosis.   ____________________________________________   PHYSICAL EXAM:  VITAL SIGNS: ED Triage Vitals  Enc Vitals Group     BP 02/02/18 0546 (!) 155/84     Pulse Rate 02/02/18 0546 77     Resp 02/02/18 0546 17     Temp 02/02/18 0546 98.2 F (36.8 C)     Temp Source 02/02/18 0546 Oral     SpO2 02/02/18 0546 98 %     Weight 02/02/18 0545 250 lb (113.4 kg)     Height 02/02/18 0545 6' (1.829 m)     Head Circumference --      Peak Flow --      Pain Score 02/02/18 0544 8     Pain Loc --      Pain Edu? --      Excl. in GC? --      Constitutional: Alert and oriented. Well appearing and in no acute distress. Eyes: Conjunctivae are normal. PERRL. EOMI. No discharge. Head: Atraumatic. ENT: No frontal and maxillary sinus tenderness.      Ears: Right tympanic membranes pearly gray with good landmarks.  Left tympanic membrane and ear canal pink.  No discharge.  Tenderness to palpation  of left pinna and tragus.  No mastoid tenderness or edema.      Nose: Mild congestion/rhinnorhea.      Mouth/Throat: Mucous membranes are moist. Oropharynx non-erythematous. Tonsils not enlarged. No exudates. Uvula midline. Neck: No stridor.   Cardiovascular: Normal rate, regular rhythm.  Good peripheral circulation. Respiratory: Normal respiratory effort without tachypnea or retractions. Lungs CTAB. Good air entry to the bases with no decreased or absent breath sounds. Musculoskeletal: Full range of motion to all extremities. No gross deformities appreciated. Neurologic:  Normal speech and language. No gross focal neurologic deficits are  appreciated.  Skin:  Skin is warm, dry and intact. No rash noted.   ____________________________________________   LABS (all labs ordered are listed, but only abnormal results are displayed)  Labs Reviewed - No data to display ____________________________________________  EKG   ____________________________________________  RADIOLOGY  No results found.  ____________________________________________    PROCEDURES  Procedure(s) performed:    Procedures    Medications - No data to display   ____________________________________________   INITIAL IMPRESSION / ASSESSMENT AND PLAN / ED COURSE  Pertinent labs & imaging results that were available during my care of the patient were reviewed by me and considered in my medical decision making (see chart for details).  Review of the  [place] was performed in accordance of the [facility] prior to dispensing any controlled drugs.   Patient's diagnosis is consistent with URI and ear infection. Vital signs and exam are reassuring.  Symptoms are consistent with otitis media secondary to URI.  Patient does have pain with palpation of pinna and tragus so he will also be covered for otitis externa.  He was given resources for medication management clinic and good Rx card.  Patient feels comfortable going home. Patient will be discharged home with prescriptions for Augmentin and Corticosporin. Patient is to follow up with PCP as needed or otherwise directed. Patient is given ED precautions to return to the ED for any worsening or new symptoms.     ____________________________________________  FINAL CLINICAL IMPRESSION(S) / ED DIAGNOSES  Final diagnoses:  Ear infection  Upper respiratory tract infection, unspecified type      NEW MEDICATIONS STARTED DURING THIS VISIT:  ED Discharge Orders        Ordered    neomycin-polymyxin-hydrocortisone (CORTISPORIN) OTIC solution  4 times daily     02/02/18 0722    amoxicillin-clavulanate  (AUGMENTIN) 875-125 MG tablet  2 times daily     02/02/18 [id number]          This chart was dictated using voice recognition software/Dragon. Despite best efforts to proofread, errors can occur which can change the meaning. Any change was purely unintentional.    Enid DerryWagner, Dailen Mcclish, PA-C 02/02/18 [id number]    Sharman CheekStafford, Phillip, MD 02/04/18 [phone]

## 2018-02-02 NOTE — ED Triage Notes (Signed)
Patient ambulatory to triage with steady gait, without difficulty or distress noted; pt reports left ear pain/swelling since yesterday

## 2018-06-08 ENCOUNTER — Encounter: Payer: Self-pay | Admitting: Emergency Medicine

## 2018-06-08 ENCOUNTER — Emergency Department: Payer: Self-pay

## 2018-06-08 ENCOUNTER — Emergency Department
Admission: EM | Admit: 2018-06-08 | Discharge: 2018-06-08 | Disposition: A | Discharge status: Home / Self Care | Payer: Self-pay | Attending: Emergency Medicine | Admitting: Emergency Medicine

## 2018-06-08 ENCOUNTER — Other Ambulatory Visit: Payer: Self-pay

## 2018-06-08 DIAGNOSIS — Z79899 Other long term (current) drug therapy: Secondary | ICD-10-CM | POA: Insufficient documentation

## 2018-06-08 DIAGNOSIS — F17210 Nicotine dependence, cigarettes, uncomplicated: Secondary | ICD-10-CM | POA: Insufficient documentation

## 2018-06-08 DIAGNOSIS — M25572 Pain in left ankle and joints of left foot: Secondary | ICD-10-CM | POA: Insufficient documentation

## 2018-06-08 MED ORDER — TRAMADOL HCL 50 MG PO TABS: 50.0000 mg | ORAL_TABLET | Freq: Two times a day (BID) | ORAL | 0 refills | Status: DC | PRN

## 2018-06-08 MED ORDER — NAPROXEN 500 MG PO TABS: 500.0000 mg | ORAL_TABLET | Freq: Two times a day (BID) | ORAL | Status: DC

## 2018-06-08 NOTE — ED Provider Notes (Signed)
[facility] Emergency Department Provider Note   ____________________________________________   First MD Initiated Contact with Patient 06/08/18 1503     (approximate)  I have reviewed the triage vital signs and the nursing notes.   HISTORY  Chief Complaint Ankle Pain    HPI Peter Stanley is a 37 y.o. male for ankle pain secondary to a twisting incident last night.  Patient he stepped off porch last night and rolled his ankle.  Patient had pain increases with weightbearing.  Patient state noticed bruising this morning.  Patient state internal fixations secondary to previous injury to the left leg.  Patient rates the pain as 8/10.  Patient described pain is "aching".  No palliative measures prior to arrival.   Past Medical History:  Diagnosis Date  . Allergy   . Anxiety     There are no active problems to display for this patient.   History reviewed. No pertinent surgical history.  Prior to Admission medications   Medication Sig Start Date End Date Taking? Authorizing Provider  azithromycin (ZITHROMAX Z-PAK) 250 MG tablet 2 pills today then 1 pill a day for 4 days 01/28/18   Sherrie MustacheFisher, Roselyn BeringSusan W, PA-C  benzonatate (TESSALON) 200 MG capsule Take 1 capsule (200 mg total) by mouth 3 (three) times daily as needed for cough. 01/28/18   Fisher, Roselyn BeringSusan W, PA-C  naproxen (NAPROSYN) 500 MG tablet Take 1 tablet (500 mg total) by mouth 2 (two) times daily with a meal. 06/08/18   Joni ReiningSmith, Ronald K, PA-C  traMADol (ULTRAM) 50 MG tablet Take 1 tablet (50 mg total) by mouth every 12 (twelve) hours as needed. 06/08/18   Joni ReiningSmith, Ronald K, PA-C    Allergies Vicodin [hydrocodone-acetaminophen]  No family history on file.  Social History Social History   Tobacco Use  . Smoking status: Current Every Day Smoker    Packs/day: 1.00    Types: Cigarettes  . Smokeless tobacco: Never Used  Substance Use Topics  . Alcohol use: No  . Drug use: Not on file    Review of  Systems Constitutional: No fever/chills Eyes: No visual changes. ENT: No sore throat. Cardiovascular: Denies chest pain. Respiratory: Denies shortness of breath. Gastrointestinal: No abdominal pain.  No nausea, no vomiting.  No diarrhea.  No constipation. Genitourinary: Negative for dysuria. Musculoskeletal: Left ankle pain. Skin: Negative for rash. Neurological: Negative for headaches, focal weakness or numbness. Psychiatric:Anxiety Allergic/Immunilogical: Vicodin.  ____________________________________________   PHYSICAL EXAM:  VITAL SIGNS: ED Triage Vitals [06/08/18 1443]  Enc Vitals Group     BP      Pulse      Resp      Temp      Temp src      SpO2      Weight 250 lb (113.4 kg)     Height 6' (1.829 m)     Head Circumference      Peak Flow      Pain Score 8     Pain Loc      Pain Edu?      Excl. in GC?     Constitutional: Alert and oriented. Well appearing and in no acute distress. Cardiovascular: Normal rate, regular rhythm. Grossly normal heart sounds.  Good peripheral circulation. Respiratory: Normal respiratory effort.  No retractions. Lungs CTAB. Musculoskeletal: No obvious deformity.  .  No joint effusions.  Moderate guarding palpation medial malleolus. Neurologic:  Normal speech and language. No gross focal neurologic deficits are appreciated. No gait instability. Skin:  Skin is warm, dry and intact. No rash noted. Psychiatric: Mood and affect are normal. Speech and behavior are normal.  ____________________________________________   LABS (all labs ordered are listed, but only abnormal results are displayed)  Labs Reviewed - No data to display ____________________________________________  EKG   ____________________________________________  RADIOLOGY  ED MD interpretation:    Official radiology report(s): Dg Ankle Complete Left  Result Date: 06/08/2018 CLINICAL DATA:  Left ankle pain after a twisting injury last night. Bruising. EXAM: LEFT  ANKLE COMPLETE - 3+ VIEW COMPARISON:  None. FINDINGS: There is no evidence of fracture, dislocation, or joint effusion. There is no evidence of arthropathy or other focal bone abnormality. Intramedullary nail in the tibia with fixation screws. Soft tissues are unremarkable. IMPRESSION: Negative. Electronically Signed   By: Francene Boyers M.D.   On: 06/08/2018 15:32    ____________________________________________   PROCEDURES  Procedure(s) performed:   Procedures  Critical Care performed:   ____________________________________________   INITIAL IMPRESSION / ASSESSMENT AND PLAN / ED COURSE  As part of my medical decision making, I reviewed the following data within the electronic MEDICAL RECORD NUMBER    Left ankle pain secondary to a twisting incident.  Discussed x-ray findings with patient.  Patient given discharge care instruction.  Patient advised to follow orthopedic if complaints persist.  Patient given crutches to assist with ambulation and take medication as directed.      ____________________________________________   FINAL CLINICAL IMPRESSION(S) / ED DIAGNOSES  Final diagnoses:  Acute left ankle pain     ED Discharge Orders        Ordered    traMADol (ULTRAM) 50 MG tablet  Every 12 hours PRN     06/08/18 1545    naproxen (NAPROSYN) 500 MG tablet  2 times daily with meals     06/08/18 1545       Note:  This document was prepared using Dragon voice recognition software and may include unintentional dictation errors.    Joni Reining, PA-C 06/08/18 1549    Minna Antis, MD 06/09/18 1416

## 2018-06-08 NOTE — Discharge Instructions (Addendum)
Ambulate with crutches for 3 to 5 days as needed.

## 2018-06-08 NOTE — ED Triage Notes (Signed)
Pt comes into the ED via POV c/o left ankle pain after he stepped off his porch last night and rolled it.  Patient has bruising noted to the ankle but no deformity.
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
## 2018-12-28 ENCOUNTER — Other Ambulatory Visit
Admission: RE | Admit: 2018-12-28 | Discharge: 2018-12-28 | Disposition: A | Discharge status: Home / Self Care | Attending: Family Medicine | Admitting: Family Medicine

## 2018-12-28 NOTE — ED Notes (Signed)
Patient ambulatory to triage with steady gait, without difficulty or distress noted, in custody of      [place] [employer] officer Pride for forensic blood draw; pt A&Ox3, with no c/o voiced and denies need to see ED provider; pt voices good understanding of blood draw to be performed for forensic testing & consent signed; pt verifies identity with name and DOB; using sealed kit provided by officer, tourniquet applied to left upper arm; left antecubital region prepped with betadine swab and allowed to dry completely; needle inserted and 2 grey top blood tubes collected; tourniquet removed, needle removed & intact, dressing applied; tubes labeled, given to officer and placed in sealed container using chain of custody; pt tolerated well and continues to deny c/o or need to see ED provider; pt d/c in police custody

## 2020-04-04 ENCOUNTER — Emergency Department
Admission: EM | Admit: 2020-04-04 | Discharge: 2020-04-04 | Disposition: A | Discharge status: Home / Self Care | Attending: Emergency Medicine | Admitting: Emergency Medicine

## 2020-04-04 ENCOUNTER — Encounter: Payer: Self-pay | Admitting: *Deleted

## 2020-04-04 ENCOUNTER — Other Ambulatory Visit: Payer: Self-pay

## 2020-04-04 DIAGNOSIS — R369 Urethral discharge, unspecified: Secondary | ICD-10-CM | POA: Insufficient documentation

## 2020-04-04 DIAGNOSIS — F17210 Nicotine dependence, cigarettes, uncomplicated: Secondary | ICD-10-CM | POA: Insufficient documentation

## 2020-04-04 DIAGNOSIS — A549 Gonococcal infection, unspecified: Secondary | ICD-10-CM | POA: Insufficient documentation

## 2020-04-04 HISTORY — DX: Gonococcal infection, unspecified: A54.9

## 2020-04-04 LAB — URINALYSIS, COMPLETE (UACMP) WITH MICROSCOPIC
Bacteria, UA: NONE SEEN
Bilirubin Urine: NEGATIVE
Glucose, UA: NEGATIVE mg/dL
Ketones, ur: NEGATIVE mg/dL
Nitrite: NEGATIVE
Protein, ur: NEGATIVE mg/dL
Specific Gravity, Urine: 1.025 (ref 1.005–1.030)
Squamous Epithelial / LPF: NONE SEEN (ref 0–5)
WBC, UA: >50 WBC/hpf — ABNORMAL HIGH (ref 0–5)
pH: 5 (ref 5.0–8.0)

## 2020-04-04 LAB — CHLAMYDIA/NGC RT PCR (ARMC ONLY)
Chlamydia Tr: NOT DETECTED
N gonorrhoeae: DETECTED — AB

## 2020-04-04 MED ORDER — CEFTRIAXONE SODIUM 250 MG IJ SOLR
250.0000 mg | Freq: Once | INTRAMUSCULAR | Status: AC
  Administered 2020-04-04: 250 mg via INTRAMUSCULAR
  Filled 2020-04-04: qty 250

## 2020-04-04 MED ORDER — AZITHROMYCIN 500 MG PO TABS
1000.0000 mg | ORAL_TABLET | Freq: Once | ORAL | Status: AC
  Administered 2020-04-04: 1000 mg via ORAL
  Filled 2020-04-04: qty 2

## 2020-04-04 NOTE — ED Notes (Signed)
Med Hold 1130

## 2020-04-04 NOTE — Discharge Instructions (Addendum)
Follow-up in the health department 1 week for recheck.  Avoid sexual activity for 1 week, all symptoms need to be resolved before returning to sexual activity.  Please notify all partners of your diagnosis and their need to seek treatment.

## 2020-04-04 NOTE — ED Provider Notes (Signed)
[facility] EMERGENCY DEPARTMENT Provider Note   CSN: [id number] Arrival date & time: 04/04/20  1910     History Chief Complaint  Patient presents with  . Exposure to STD    Peter Stanley is a 39 y.o. male presents to the emergency department for evaluation of burning with urination and white discharge on the tip of the penis.  Symptoms been present for 1 day.  Denies any pain or testicular discomfort.  No fevers or abdominal pain.  Has had unprotected intercourse with new partner.  HPI     Past Medical History:  Diagnosis Date  . Allergy   . Anxiety   . Gonorrhea     There are no problems to display for this patient.   Past Surgical History:  Procedure Laterality Date  . FEMUR FRACTURE SURGERY    . TIBIA FRACTURE SURGERY         No family history on file.  Social History   Tobacco Use  . Smoking status: Current Every Day Smoker    Packs/day: 1.00    Types: Cigarettes  . Smokeless tobacco: Never Used  Substance Use Topics  . Alcohol use: No  . Drug use: Never    Home Medications Prior to Admission medications   Medication Sig Start Date End Date Taking? Authorizing Provider  azithromycin (ZITHROMAX Z-PAK) 250 MG tablet 2 pills today then 1 pill a day for 4 days 01/28/18   Sherrie Mustache Roselyn Bering, PA-C  benzonatate (TESSALON) 200 MG capsule Take 1 capsule (200 mg total) by mouth 3 (three) times daily as needed for cough. 01/28/18   Fisher, Roselyn Bering, PA-C  naproxen (NAPROSYN) 500 MG tablet Take 1 tablet (500 mg total) by mouth 2 (two) times daily with a meal. 06/08/18   Joni Reining, PA-C  traMADol (ULTRAM) 50 MG tablet Take 1 tablet (50 mg total) by mouth every 12 (twelve) hours as needed. 06/08/18   Joni Reining, PA-C    Allergies    Vicodin [hydrocodone-acetaminophen]  Review of Systems   Review of Systems  Constitutional: Negative for chills and fever.  Gastrointestinal: Negative for abdominal pain.  Genitourinary: Positive  for discharge and dysuria. Negative for difficulty urinating and flank pain.    Physical Exam Updated Vital Signs BP 139/88   Pulse 86   Temp 98.4 F (36.9 C) (Oral)   Resp 16   Wt 113.4 kg   SpO2 97%   BMI 33.91 kg/m   Physical Exam Vitals reviewed.  Constitutional:      Appearance: He is well-developed.  HENT:     Head: Normocephalic and atraumatic.  Eyes:     Conjunctiva/sclera: Conjunctivae normal.  Cardiovascular:     Rate and Rhythm: Normal rate.  Pulmonary:     Effort: Pulmonary effort is normal. No respiratory distress.  Genitourinary:    Comments: Positive penal discharge, no scrotal tenderness or skin lesions. Musculoskeletal:        General: Normal range of motion.     Cervical back: Normal range of motion.  Skin:    General: Skin is warm.     Findings: No rash.  Neurological:     Mental Status: He is alert and oriented to person, place, and time.  Psychiatric:        Behavior: Behavior normal.        Thought Content: Thought content normal.     ED Results / Procedures / Treatments   Labs (all labs ordered are listed, but  only abnormal results are displayed) Labs Reviewed  CHLAMYDIA/NGC RT PCR ([facility] ONLY) - Abnormal; Notable for the following components:      Result Value   N gonorrhoeae DETECTED (*)    All other components within normal limits  URINALYSIS, COMPLETE (UACMP) WITH MICROSCOPIC - Abnormal; Notable for the following components:   Color, Urine YELLOW (*)    APPearance HAZY (*)    Hgb urine dipstick SMALL (*)    Leukocytes,Ua LARGE (*)    WBC, UA >50 (*)    All other components within normal limits    EKG None  Radiology No results found.  Procedures Procedures (including critical care time)  Medications Ordered in ED Medications  cefTRIAXone (ROCEPHIN) injection 250 mg (has no administration in time range)  azithromycin (ZITHROMAX) tablet 1,000 mg (has no administration in time range)    ED Course  I have  reviewed the triage vital signs and the nursing notes.  Pertinent labs & imaging results that were available during my care of the patient were reviewed by me and considered in my medical decision making (see chart for details).    MDM Rules/Calculators/A&P                      39 year old male positive for gonorrhea.  He is treated with 250 mg ceftriaxone IM and 1 g azithromycin p.o. he is educated on signs and symptoms return to the ED for as well as follow-up and notifying his sexual partners Final Clinical Impression(s) / ED Diagnoses Final diagnoses:  Gonorrhea    Rx / DC Orders ED Discharge Orders    None       Renata Caprice 04/04/20 2238    Blake Divine, MD 04/05/20 0001

## 2020-04-04 NOTE — ED Triage Notes (Signed)
Pt is here for burning with urination and white discharge since yesterday.  Pt states that he has had an STD before and it was the same.

## 2020-06-23 ENCOUNTER — Other Ambulatory Visit: Payer: Self-pay

## 2020-06-23 ENCOUNTER — Emergency Department
Admission: EM | Admit: 2020-06-23 | Discharge: 2020-06-23 | Disposition: A | Discharge status: Home / Self Care | Payer: Self-pay | Attending: Emergency Medicine | Admitting: Emergency Medicine

## 2020-06-23 DIAGNOSIS — R6884 Jaw pain: Secondary | ICD-10-CM | POA: Insufficient documentation

## 2020-06-23 DIAGNOSIS — R600 Localized edema: Secondary | ICD-10-CM | POA: Insufficient documentation

## 2020-06-23 DIAGNOSIS — Z5321 Procedure and treatment not carried out due to patient leaving prior to being seen by health care provider: Secondary | ICD-10-CM | POA: Insufficient documentation

## 2020-06-23 NOTE — ED Notes (Signed)
Pt called x5. No response

## 2020-06-23 NOTE — ED Notes (Signed)
Called 3x no response 

## 2020-06-23 NOTE — ED Triage Notes (Signed)
Pt complains of right lower jaw pain. Pt with slight swelling noted, in no acute distress.

## 2021-10-14 ENCOUNTER — Other Ambulatory Visit: Payer: Self-pay

## 2021-10-14 ENCOUNTER — Emergency Department
Admission: EM | Admit: 2021-10-14 | Discharge: 2021-10-14 | Disposition: A | Discharge status: Home / Self Care | Payer: Self-pay | Attending: Student in an Organized Health Care Education/Training Program | Admitting: Student in an Organized Health Care Education/Training Program

## 2021-10-14 DIAGNOSIS — J101 Influenza due to other identified influenza virus with other respiratory manifestations: Secondary | ICD-10-CM | POA: Insufficient documentation

## 2021-10-14 DIAGNOSIS — Z20822 Contact with and (suspected) exposure to covid-19: Secondary | ICD-10-CM | POA: Insufficient documentation

## 2021-10-14 DIAGNOSIS — F17210 Nicotine dependence, cigarettes, uncomplicated: Secondary | ICD-10-CM | POA: Insufficient documentation

## 2021-10-14 LAB — RESP PANEL BY RT-PCR (FLU A&B, COVID) ARPGX2
Influenza A by PCR: POSITIVE — AB
Influenza B by PCR: NEGATIVE
SARS Coronavirus 2 by RT PCR: NEGATIVE

## 2021-10-14 MED ORDER — DEXAMETHASONE SODIUM PHOSPHATE 10 MG/ML IJ SOLN: 10.0000 mg | Freq: Once | INTRAMUSCULAR | Status: DC

## 2021-10-14 MED ORDER — OXYCODONE HCL 5 MG PO TABS: 10.0000 mg | ORAL_TABLET | Freq: Once | ORAL | Status: DC

## 2021-10-14 MED ORDER — IBUPROFEN 800 MG PO TABS
800.0000 mg | ORAL_TABLET | Freq: Once | ORAL | Status: AC
  Administered 2021-10-14: 800 mg via ORAL

## 2021-10-14 MED ORDER — ONDANSETRON 4 MG PO TBDP
4.0000 mg | ORAL_TABLET | Freq: Once | ORAL | Status: AC
  Administered 2021-10-14: 4 mg via ORAL

## 2021-10-14 MED ORDER — ONDANSETRON 4 MG PO TBDP: 4.0000 mg | ORAL_TABLET | Freq: Three times a day (TID) | ORAL | 0 refills | Status: DC | PRN

## 2021-10-14 NOTE — ED Provider Notes (Signed)
[facility] Emergency Department Provider Note ____________________________________________   None    (approximate)  I have reviewed the triage vital signs and the nursing notes.   HISTORY  Chief Complaint Headache and Generalized Body Aches  HPI Peter Stanley is a 40 y.o. male with no significant past medical history presents to the emergency department for treatment and evaluation of fever, nausea, vomiting, headache.  He has had 1 episode of vomiting and multiple episodes of diarrhea this morning.  No alleviating measures attempted      Past Medical History:  Diagnosis Date   Allergy    Anxiety    Gonorrhea     There are no problems to display for this patient.   Past Surgical History:  Procedure Laterality Date   FEMUR FRACTURE SURGERY     TIBIA FRACTURE SURGERY      Prior to Admission medications   Medication Sig Start Date End Date Taking? Authorizing Provider  ondansetron (ZOFRAN-ODT) 4 MG disintegrating tablet Take 1 tablet (4 mg total) by mouth every 8 (eight) hours as needed for nausea or vomiting. 10/14/21  Yes Laurina Fischl B, FNP  azithromycin (ZITHROMAX Z-PAK) 250 MG tablet 2 pills today then 1 pill a day for 4 days 01/28/18   Sherrie Mustache Roselyn Bering, PA-C  benzonatate (TESSALON) 200 MG capsule Take 1 capsule (200 mg total) by mouth 3 (three) times daily as needed for cough. 01/28/18   Fisher, Roselyn Bering, PA-C  naproxen (NAPROSYN) 500 MG tablet Take 1 tablet (500 mg total) by mouth 2 (two) times daily with a meal. 06/08/18   Joni Reining, PA-C  traMADol (ULTRAM) 50 MG tablet Take 1 tablet (50 mg total) by mouth every 12 (twelve) hours as needed. 06/08/18   Joni Reining, PA-C    Allergies Vicodin [hydrocodone-acetaminophen]  No family history on file.  Social History Social History   Tobacco Use   Smoking status: Every Day    Packs/day: 1.00    Types: Cigarettes   Smokeless tobacco: Never  Substance Use Topics   Alcohol  use: No   Drug use: Never    Review of Systems  Constitutional: Positive for fever/chills Eyes: No visual changes. ENT: Positive for sore throat. Cardiovascular: Denies chest pain. Respiratory: Denies shortness of breath.  Positive for cough  Gastrointestinal: No abdominal pain.  Positive for nausea, vomiting, diarrhea.  Genitourinary: Negative for dysuria. Musculoskeletal: Positive for myalgia  skin: Negative for rash. Neurological: Negative for headaches, focal weakness or numbness  ____________________________________________   PHYSICAL EXAM:  VITAL SIGNS: ED Triage Vitals [10/14/21 0949]  Enc Vitals Group     BP (!) 146/83     Pulse Rate 98     Resp 18     Temp 98.6 F (37 C)     Temp Source Oral     SpO2 94 %     Weight 260 lb (117.9 kg)     Height 6' (1.829 m)     Head Circumference      Peak Flow      Pain Score 7     Pain Loc      Pain Edu?      Excl. in GC?     Constitutional: Alert and oriented. Well appearing and in no acute distress. Eyes: Conjunctivae are normal.  Head: Atraumatic. Nose: No congestion/rhinnorhea. Mouth/Throat: Mucous membranes are moist.  Oropharynx non-erythematous. Neck: No stridor.   Hematological/Lymphatic/Immunilogical: No cervical lymphadenopathy. Cardiovascular: Normal rate, regular rhythm. Grossly normal heart  sounds.  Good peripheral circulation. Respiratory: Normal respiratory effort.  No retractions. Lungs CTAB. Gastrointestinal: Soft and nontender. No distention. No abdominal bruits. Genitourinary:  Musculoskeletal: No lower extremity tenderness nor edema.  No joint effusions. Neurologic:  Normal speech and language. No gross focal neurologic deficits are appreciated. No gait instability. Skin:  Skin is warm, dry and intact. No rash noted. Psychiatric: Mood and affect are normal. Speech and behavior are normal.  ____________________________________________   LABS (all labs ordered are listed, but only abnormal  results are displayed)  Labs Reviewed  RESP PANEL BY RT-PCR (FLU A&B, COVID) ARPGX2 - Abnormal; Notable for the following components:      Result Value   Influenza A by PCR POSITIVE (*)    All other components within normal limits   ____________________________________________  EKG  Not indicated ____________________________________________  RADIOLOGY  ED MD interpretation:    Not indicated I, [place], personally viewed and evaluated these images (plain radiographs) as part of my medical decision making, as well as reviewing the written report by the radiologist.  Official radiology report(s): No results found.  ____________________________________________   PROCEDURES  Procedure(s) performed (including Critical Care):  Procedures  ____________________________________________   INITIAL IMPRESSION / ASSESSMENT AND PLAN     41 year old male presenting to the emergency department for treatment and evaluation of viral symptoms as described in the HPI.  Plan will be to get a respiratory panel that will include COVID and influenza.  Zofran ordered for nausea.  DIFFERENTIAL DIAGNOSIS  COVID, influenza, viral syndrome  ED COURSE  Influenza positive.  No additional nausea or reported vomiting or diarrhea after administration of Zofran.  Vital signs are reassuring.  Patient will be given a work excuse and encouraged to take Tylenol, ibuprofen for sore throat, body aches, fever.  Zofran prescribed if nausea returns.  He was encouraged to follow-up with primary care or return to the emergency department for symptoms of change or worsen.     ___________________________________________   FINAL CLINICAL IMPRESSION(S) / ED DIAGNOSES  Final diagnoses:  Influenza A     ED Discharge Orders          Ordered    ondansetron (ZOFRAN-ODT) 4 MG disintegrating tablet  Every 8 hours PRN        10/14/21 1133             Peter Stanley was evaluated in  Emergency Department on 10/14/2021 for the symptoms described in the history of present illness. He was evaluated in the context of the global COVID-19 pandemic, which necessitated consideration that the patient might be at risk for infection with the SARS-CoV-2 virus that causes COVID-19. Institutional protocols and algorithms that pertain to the evaluation of patients at risk for COVID-19 are in a state of rapid change based on information released by regulatory bodies including the CDC and federal and state organizations. These policies and algorithms were followed during the patient's care in the ED.   Note:  This document was prepared using Dragon voice recognition software and may include unintentional dictation errors.    Chinita Pester, FNP 10/14/21 1322    Willy Eddy, MD 10/14/21 [phone]

## 2021-10-14 NOTE — Discharge Instructions (Signed)
Take tylenol or ibuprofen for body aches, headache, and fever. Take the zofran for nausea or vomiting. Follow up with primary care for symptoms not improving over the week. Return to the ER for symptoms that change or worsen if unable to schedule an appointment.

## 2021-10-14 NOTE — ED Triage Notes (Signed)
Pt started to feel bad yesterday and worse this am, states others in his home have been sick also. Pt reports headache, body aches, and cough

## 2021-10-14 NOTE — ED Notes (Signed)
Patient declined discharge vital signs. 

## 2021-11-01 ENCOUNTER — Encounter (HOSPITAL_COMMUNITY): Payer: Self-pay

## 2021-11-01 ENCOUNTER — Emergency Department (HOSPITAL_COMMUNITY): Payer: Self-pay

## 2021-11-01 ENCOUNTER — Emergency Department (HOSPITAL_COMMUNITY)
Admission: EM | Admit: 2021-11-01 | Discharge: 2021-11-01 | Disposition: A | Discharge status: Home / Self Care | Payer: Self-pay | Attending: Emergency Medicine | Admitting: Emergency Medicine

## 2021-11-01 DIAGNOSIS — M25561 Pain in right knee: Secondary | ICD-10-CM | POA: Insufficient documentation

## 2021-11-01 DIAGNOSIS — F17210 Nicotine dependence, cigarettes, uncomplicated: Secondary | ICD-10-CM | POA: Insufficient documentation

## 2021-11-01 DIAGNOSIS — W010XXA Fall on same level from slipping, tripping and stumbling without subsequent striking against object, initial encounter: Secondary | ICD-10-CM | POA: Insufficient documentation

## 2021-11-01 DIAGNOSIS — M7989 Other specified soft tissue disorders: Secondary | ICD-10-CM | POA: Insufficient documentation

## 2021-11-01 MED ORDER — OXYCODONE-ACETAMINOPHEN 5-325 MG PO TABS
1.0000 | ORAL_TABLET | Freq: Once | ORAL | Status: AC
  Administered 2021-11-01: 1 via ORAL
  Filled 2021-11-01: qty 1

## 2021-11-01 MED ORDER — OXYCODONE-ACETAMINOPHEN 5-325 MG PO TABS: 1.0000 | ORAL_TABLET | Freq: Four times a day (QID) | ORAL | 0 refills | Status: DC | PRN

## 2021-11-01 MED ORDER — OXYCODONE-ACETAMINOPHEN 5-325 MG PO TABS
1.0000 | ORAL_TABLET | Freq: Four times a day (QID) | ORAL | 0 refills | Status: DC | PRN
Start: 2021-11-01 — End: 2021-11-01

## 2021-11-01 NOTE — Progress Notes (Signed)
Orthopedic Tech Progress Note Patient Details:  Peter Stanley Jan 19, 1981 [id number]  Ortho Devices Type of Ortho Device: Knee Immobilizer, Crutches Ortho Device/Splint Location: RLE Ortho Device/Splint Interventions: Ordered, Application, Adjustment   Post Interventions Patient Tolerated: Well Instructions Provided: Adjustment of device, Care of device, Poper ambulation with device  [name] 11/01/2021, 5:38 AM

## 2021-11-01 NOTE — ED Triage Notes (Signed)
Pt states that he is slipped in some mud and his R knee popped and is now swollen

## 2021-11-01 NOTE — ED Provider Notes (Signed)
[facility] EMERGENCY DEPARTMENT Provider Note   CSN: [id number] Arrival date & time: 11/01/21  0041     History Chief Complaint  Patient presents with   Knee Pain    Peter Stanley is a 40 y.o. male.  Patient presents chief complaint of right knee pain.  He states that he had an injury in the right knee about 5 years ago that required surgical repair, since then he walks with a stiff right knee.  Today he lost his balance and fell on the mud onto his right knee and complaining of increased swelling and pain to the right knee.  Pain is described as sharp and aching and persistent.  Denies injury elsewhere no headache or neck pain denies loss of consciousness no upper extremity pain reported.  No reports of fevers or cough or vomiting or diarrhea.      Past Medical History:  Diagnosis Date   Allergy    Anxiety    Gonorrhea     There are no problems to display for this patient.   Past Surgical History:  Procedure Laterality Date   FEMUR FRACTURE SURGERY     TIBIA FRACTURE SURGERY         No family history on file.  Social History   Tobacco Use   Smoking status: Every Day    Packs/day: 1.00    Types: Cigarettes   Smokeless tobacco: Never  Substance Use Topics   Alcohol use: No   Drug use: Never    Home Medications Prior to Admission medications   Medication Sig Start Date End Date Taking? Authorizing Provider  azithromycin (ZITHROMAX Z-PAK) 250 MG tablet 2 pills today then 1 pill a day for 4 days 01/28/18   Sherrie Mustache Roselyn Bering, PA-C  benzonatate (TESSALON) 200 MG capsule Take 1 capsule (200 mg total) by mouth 3 (three) times daily as needed for cough. 01/28/18   Fisher, Roselyn Bering, PA-C  naproxen (NAPROSYN) 500 MG tablet Take 1 tablet (500 mg total) by mouth 2 (two) times daily with a meal. 06/08/18   Joni Reining, PA-C  ondansetron (ZOFRAN-ODT) 4 MG disintegrating tablet Take 1 tablet (4 mg total) by mouth every 8 (eight) hours as needed for  nausea or vomiting. 10/14/21   Triplett, Rulon Eisenmenger B, FNP  traMADol (ULTRAM) 50 MG tablet Take 1 tablet (50 mg total) by mouth every 12 (twelve) hours as needed. 06/08/18   Joni Reining, PA-C    Allergies    Vicodin [hydrocodone-acetaminophen]  Review of Systems   Review of Systems  Constitutional:  Negative for fever.  HENT:  Negative for ear pain and sore throat.   Eyes:  Negative for pain.  Respiratory:  Negative for cough.   Cardiovascular:  Negative for chest pain.  Gastrointestinal:  Negative for abdominal pain.  Genitourinary:  Negative for flank pain.  Musculoskeletal:  Negative for back pain.  Skin:  Negative for color change and rash.  Neurological:  Negative for syncope.  All other systems reviewed and are negative.  Physical Exam Updated Vital Signs BP 140/90 (BP Location: Right Arm)    Pulse 87    Temp 98.6 F (37 C) (Oral)    Resp 20    SpO2 96%   Physical Exam Constitutional:      Appearance: He is well-developed.  HENT:     Head: Normocephalic.     Nose: Nose normal.  Eyes:     Extraocular Movements: Extraocular movements intact.  Cardiovascular:  Rate and Rhythm: Normal rate.  Pulmonary:     Effort: Pulmonary effort is normal.  Musculoskeletal:     Comments: Swelling noted to right knee.  No abnormal warmth palpated.  Decreased range of motion of the right knee which patient states is his baseline.  No anterior posterior laxity noted.  No varus or valgus laxity noted.  Neurovascular intact distally, compartments are soft.  Skin:    Coloration: Skin is not jaundiced.  Neurological:     Mental Status: He is alert. Mental status is at baseline.    ED Results / Procedures / Treatments   Labs (all labs ordered are listed, but only abnormal results are displayed) Labs Reviewed - No data to display  EKG None  Radiology DG Knee Complete 4 Views Right  Result Date: 11/01/2021 CLINICAL DATA:  Status post fall. EXAM: RIGHT KNEE - COMPLETE 4+ VIEW  COMPARISON:  April 22, 2017 FINDINGS: No evidence of an acute fracture or dislocation. A radiopaque intramedullary rod and distal radiopaque fixation screws are seen within the visualized portion of the right femur. This is seen on the prior study. A moderate to large joint effusion is seen. IMPRESSION: 1. Moderate to large joint effusion without an acute osseous abnormality. 2. Prior open reduction and internal fixation of the right femur. Electronically Signed   By: Aram Candela M.D.   On: 11/01/2021 01:22    Procedures .Ortho Injury Treatment  Date/Time: 11/01/2021 4:30 AM Performed by: Cheryll Cockayne, MD Authorized by: Cheryll Cockayne, MD  Post-procedure neurovascular assessment: post-procedure neurovascularly intact Comments: Right knee immobilizer placed.  Crutch training provided.     Medications Ordered in ED Medications  oxyCODONE-acetaminophen (PERCOCET/ROXICET) 5-325 MG per tablet 1 tablet (has no administration in time range)    ED Course  I have reviewed the triage vital signs and the nursing notes.  Pertinent labs & imaging results that were available during my care of the patient were reviewed by me and considered in my medical decision making (see chart for details).    MDM Rules/Calculators/A&P                         X-rays of the right knee show moderate to large joint effusion without any acute osseous abnormality.  Patient placed in a knee immobilizer given crutch training and Percocet for pain management.  Recommending outpatient follow-up again with his orthopedic surgeon within the week.  Advising return for worsening pain fevers or any additional concerns.     Final Clinical Impression(s) / ED Diagnoses Final diagnoses:  Acute pain of right knee    Rx / DC Orders ED Discharge Orders     None        Cheryll Cockayne, MD 11/01/21 0430

## 2021-11-01 NOTE — Discharge Instructions (Addendum)
Follow-up with your orthopedic surgeon at [facility].  Return if you have fevers cough, worsening pain or any additional concerns.

## 2022-03-30 ENCOUNTER — Emergency Department
Admission: EM | Admit: 2022-03-30 | Discharge: 2022-03-30 | Disposition: A | Discharge status: Home / Self Care | Payer: Self-pay | Attending: Emergency Medicine | Admitting: Emergency Medicine

## 2022-03-30 ENCOUNTER — Encounter: Payer: Self-pay | Admitting: Emergency Medicine

## 2022-03-30 ENCOUNTER — Emergency Department: Payer: Self-pay

## 2022-03-30 ENCOUNTER — Other Ambulatory Visit: Payer: Self-pay

## 2022-03-30 DIAGNOSIS — J181 Lobar pneumonia, unspecified organism: Secondary | ICD-10-CM | POA: Insufficient documentation

## 2022-03-30 DIAGNOSIS — J111 Influenza due to unidentified influenza virus with other respiratory manifestations: Secondary | ICD-10-CM

## 2022-03-30 DIAGNOSIS — J189 Pneumonia, unspecified organism: Secondary | ICD-10-CM

## 2022-03-30 DIAGNOSIS — Z20822 Contact with and (suspected) exposure to covid-19: Secondary | ICD-10-CM | POA: Insufficient documentation

## 2022-03-30 DIAGNOSIS — D72829 Elevated white blood cell count, unspecified: Secondary | ICD-10-CM | POA: Insufficient documentation

## 2022-03-30 DIAGNOSIS — R519 Headache, unspecified: Secondary | ICD-10-CM | POA: Insufficient documentation

## 2022-03-30 LAB — COMPREHENSIVE METABOLIC PANEL
ALT: 30 U/L (ref 0–44)
AST: 30 U/L (ref 15–41)
Albumin: 4 g/dL (ref 3.5–5.0)
Alkaline Phosphatase: 87 U/L (ref 38–126)
Anion gap: 10 (ref 5–15)
BUN: 15 mg/dL (ref 6–20)
CO2: 22 mmol/L (ref 22–32)
Calcium: 9.1 mg/dL (ref 8.9–10.3)
Chloride: 104 mmol/L (ref 98–111)
Creatinine, Ser: 0.95 mg/dL (ref 0.61–1.24)
GFR, Estimated: >60 mL/min (ref >60)
Glucose, Bld: 107 mg/dL — ABNORMAL HIGH (ref 70–99)
Potassium: 4.1 mmol/L (ref 3.5–5.1)
Sodium: 136 mmol/L (ref 135–145)
Total Bilirubin: 0.4 mg/dL (ref 0.3–1.2)
Total Protein: 7.8 g/dL (ref 6.5–8.1)

## 2022-03-30 LAB — CBC WITH DIFFERENTIAL/PLATELET
Abs Immature Granulocytes: 0.07 10*3/uL (ref 0.00–0.07)
Basophils Absolute: 0.1 10*3/uL (ref 0.0–0.1)
Basophils Relative: 0 %
Eosinophils Absolute: 0.3 10*3/uL (ref 0.0–0.5)
Eosinophils Relative: 2 %
HCT: 49.8 % (ref 39.0–52.0)
Hemoglobin: 16.1 g/dL (ref 13.0–17.0)
Immature Granulocytes: 1 %
Lymphocytes Relative: 12 %
Lymphs Abs: 1.8 10*3/uL (ref 0.7–4.0)
MCH: 26.8 pg (ref 26.0–34.0)
MCHC: 32.3 g/dL (ref 30.0–36.0)
MCV: 82.9 fL (ref 80.0–100.0)
Monocytes Absolute: 1.3 10*3/uL — ABNORMAL HIGH (ref 0.1–1.0)
Monocytes Relative: 9 %
Neutro Abs: 11.5 10*3/uL — ABNORMAL HIGH (ref 1.7–7.7)
Neutrophils Relative %: 76 %
Platelets: 373 10*3/uL (ref 150–400)
RBC: 6.01 MIL/uL — ABNORMAL HIGH (ref 4.22–5.81)
RDW: 14.5 % (ref 11.5–15.5)
WBC: 15 10*3/uL — ABNORMAL HIGH (ref 4.0–10.5)
nRBC: 0 % (ref 0.0–0.2)

## 2022-03-30 LAB — RESP PANEL BY RT-PCR (FLU A&B, COVID) ARPGX2
Influenza A by PCR: NEGATIVE
Influenza B by PCR: NEGATIVE
SARS Coronavirus 2 by RT PCR: NEGATIVE

## 2022-03-30 LAB — TROPONIN I (HIGH SENSITIVITY): Troponin I (High Sensitivity): <2 ng/L (ref <18)

## 2022-03-30 MED ORDER — IBUPROFEN 600 MG PO TABS
600.0000 mg | ORAL_TABLET | Freq: Once | ORAL | Status: AC
  Administered 2022-03-30: 600 mg via ORAL
  Filled 2022-03-30: qty 1

## 2022-03-30 MED ORDER — ACETAMINOPHEN 500 MG PO TABS
1000.0000 mg | ORAL_TABLET | Freq: Once | ORAL | Status: AC
  Administered 2022-03-30: 1000 mg via ORAL
  Filled 2022-03-30: qty 2

## 2022-03-30 MED ORDER — AZITHROMYCIN 250 MG PO TABS: ORAL_TABLET | ORAL | 0 refills | Status: DC

## 2022-03-30 NOTE — Discharge Instructions (Addendum)
Your chest x-ray shows signs of pneumonia in the right lower lung.  You should follow-up with primary care and have a repeat chest x-ray in about 2 months to ensure this area has healed and returned to normal. ?

## 2022-03-30 NOTE — ED Provider Notes (Signed)
? ?[facility] ?Provider Note ? ? ? Event Date/Time  ? First MD Initiated Contact with Patient 03/30/22 [phone]   ?  (approximate) ? ? ?History  ? ?Generalized Body Aches and Fever ? ? ?HPI ? ?Peter Stanley is a 41 y.o. male with no significant past medical history who comes ED complaining of body aches, generalized headache, chills, fatigue, productive cough, chest tightness and shortness of breath for the past 4 days.  Gradual onset, constant and worsening.  No chest pain.  No dizziness or syncope.  No palpitations. ? ?Wells criteria negative.  Denies known sick contacts. ?  ? ? ?Physical Exam  ? ?Triage Vital Signs: ?ED Triage Vitals [03/30/22 0709]  ?Enc Vitals Group  ?   BP 124/87  ?   Pulse Rate 91  ?   Resp 16  ?   Temp 98.3 ?F (36.8 ?C)  ?   Temp src   ?   SpO2 96 %  ?   Weight 260 lb (117.9 kg)  ?   Height 6' (1.829 m)  ?   Head Circumference   ?   Peak Flow   ?   Pain Score 5  ?   Pain Loc   ?   Pain Edu?   ?   Excl. in GC?   ? ? ?Most recent vital signs: ?Vitals:  ? 03/30/22 0709 03/30/22 0851  ?BP: 124/87 128/80  ?Pulse: 91 88  ?Resp: 16 16  ?Temp: 98.3 ?F (36.8 ?C)   ?SpO2: 96% 97%  ? ? ? ?General: Awake, no distress.  ?CV:  Good peripheral perfusion.  Regular rate and rhythm ?Resp:  Normal effort.  Slight crackles in the right base, otherwise clear to auscultation bilateral with good air movement, no wheezing ?Abd:  No distention.  Soft and nontender ?Other:  No lower extremity edema or calf tenderness.  Symmetric calf circumference.  No rash ? ? ?ED Results / Procedures / Treatments  ? ?Labs ?(all labs ordered are listed, but only abnormal results are displayed) ?Labs Reviewed  ?CBC WITH DIFFERENTIAL/PLATELET - Abnormal; Notable for the following components:  ?    Result Value  ? WBC 15.0 (*)   ? RBC 6.01 (*)   ? Neutro Abs 11.5 (*)   ? Monocytes Absolute 1.3 (*)   ? All other components within normal limits  ?COMPREHENSIVE METABOLIC PANEL - Abnormal; Notable for the following  components:  ? Glucose, Bld 107 (*)   ? All other components within normal limits  ?RESP PANEL BY RT-PCR (FLU A&B, COVID) ARPGX2  ?TROPONIN I (HIGH SENSITIVITY)  ?TROPONIN I (HIGH SENSITIVITY)  ? ? ? ?EKG ? ?Interpreted by me ?Normal sinus rhythm rate of 92.  Normal axis and intervals.  Normal QRS ST segments and T waves.  No ischemic changes. ? ? ?RADIOLOGY ?Chest x-ray viewed and interpreted by me, shows slight haziness at the right base.  Radiology report reviewed ? ? ? ?PROCEDURES: ? ?Critical Care performed: No ? ?Procedures ? ? ?MEDICATIONS ORDERED IN ED: ?Medications  ?acetaminophen (TYLENOL) tablet 1,000 mg (1,000 mg Oral Given 03/30/22 0849)  ?ibuprofen (ADVIL) tablet 600 mg (600 mg Oral Given 03/30/22 0850)  ? ? ? ?IMPRESSION / MDM / ASSESSMENT AND PLAN / ED COURSE  ?I reviewed the triage vital signs and the nursing notes. ?             ?               ? ?  Differential diagnosis includes, but is not limited to, pneumonia, viral illness, dehydration, seasonal allergies ? ? ? ?Patient presents with multitude of symptoms consistent with an influenza-like illness. Considering the patient's symptoms, medical history, and physical examination today, I have low suspicion for ACS, PE, TAD, pneumothorax, carditis, mediastinitis, CHF, or sepsis. ? ?Labs show leukocytosis of 15,000, chest x-ray shows a right basilar infiltrate which corresponds to crackles on exam.  Consistent with community-acquired pneumonia.  Vital signs are normal, not hypoxic, not septic.  Will start on azithromycin.  NSAIDs for pain and fever, does not require admission. ? ?  ? ? ?FINAL CLINICAL IMPRESSION(S) / ED DIAGNOSES  ? ?Final diagnoses:  ?Influenza-like illness  ?Community acquired pneumonia of right lower lobe of lung  ? ? ? ?Rx / DC Orders  ? ?ED Discharge Orders   ? ?      Ordered  ?  azithromycin (ZITHROMAX Z-PAK) 250 MG tablet       ? 03/30/22 0851  ? ?  ?  ? ?  ? ? ? ?Note:  This document was prepared using Dragon voice recognition  software and may include unintentional dictation errors. ?  ?Sharman Cheek, MD ?03/30/22 [phone] ? ?

## 2022-03-30 NOTE — ED Notes (Signed)
See triage note  presents with h/a and body aches  sxs' started last Thursday   subjective fever at home  afebrile on arrival ?

## 2022-03-30 NOTE — ED Provider Triage Note (Signed)
Emergency Medicine Provider Triage Evaluation Note ? ?Peter Stanley , a 41 y.o. male  was evaluated in triage.  Pt complains of fever, chills, headaches and myalgias for about 5 days, but on 2 days of chest tightness.  Half PPD smoker. ? ?Review of Systems  ?Positive: ?Negative:  ? ?Physical Exam  ?BP 124/87 (BP Location: Left Arm)   Pulse 91   Temp 98.3 ?F (36.8 ?C)   Resp 16   Ht 6' (1.829 m)   Wt 117.9 kg   SpO2 96%   BMI 35.26 kg/m?  ?Gen:   Awake, no distress   ?Resp:  Normal effort  ?MSK:   Moves extremities without difficulty  ?Other:   ? ?Medical Decision Making  ?Medically screening exam initiated at 7:15 AM.  Appropriate orders placed.  George Hugh Rieke was informed that the remainder of the evaluation will be completed by another provider, this initial triage assessment does not replace that evaluation, and the importance of remaining in the ED until their evaluation is complete. ? ? ?  ?Vladimir Crofts, MD ?03/30/22 [phone] ? ?

## 2022-03-30 NOTE — ED Triage Notes (Signed)
Pt to ED via POV stating that since Thursday night he has had body aches, fever, headache, cough, and chest tightness. Pt denies being around with COVID or flu. Pt is currently in NAD.  ?

## 2022-06-01 ENCOUNTER — Encounter: Payer: Self-pay | Admitting: Emergency Medicine

## 2022-06-01 ENCOUNTER — Other Ambulatory Visit: Payer: Self-pay

## 2022-06-01 ENCOUNTER — Emergency Department
Admission: EM | Admit: 2022-06-01 | Discharge: 2022-06-01 | Disposition: A | Discharge status: Home / Self Care | Payer: Self-pay | Attending: Emergency Medicine | Admitting: Emergency Medicine

## 2022-06-01 DIAGNOSIS — H109 Unspecified conjunctivitis: Secondary | ICD-10-CM | POA: Insufficient documentation

## 2022-06-01 DIAGNOSIS — B9689 Other specified bacterial agents as the cause of diseases classified elsewhere: Secondary | ICD-10-CM | POA: Insufficient documentation

## 2022-06-01 MED ORDER — TOBRAMYCIN 0.3 % OP SOLN: 2.0000 [drp] | OPHTHALMIC | 0 refills | Status: AC

## 2022-06-01 NOTE — Discharge Instructions (Signed)
Follow-up with Dr. Inez Pilgrim who is on-call for [facility] if any continued problems.  Again using the eyedrops every 4 hours while awake.  You will need to wash her hands each time you touch her eyes as this can be transmitted to other people and surfaces and is considered very contagious.

## 2022-06-01 NOTE — ED Triage Notes (Signed)
Patient reports red itching eyes that started Saturday. Woke Sunday with crusty green drainage to bilateral eyes. Reports continued bilateral eye irritation and drainage.

## 2022-06-01 NOTE — ED Provider Notes (Signed)
[facility] Provider Note    Event Date/Time   First MD Initiated Contact with Patient 06/01/22 [phone]     (approximate)   History   Conjunctivitis   HPI  Peter Stanley is a 41 y.o. male   presents to the ED with complaint of bilateral eye drainage.  Patient states that started 2 days ago and has been thick yellowish-green in color.  He is unaware of any contact with pinkeye.  Each morning he has woken up with his lashes matted shut.  He denies any foreign body or exposure to chemicals.      Physical Exam   Triage Vital Signs: ED Triage Vitals  Enc Vitals Group     BP 06/01/22 0635 (!) 133/100     Pulse Rate 06/01/22 0635 74     Resp 06/01/22 0635 16     Temp 06/01/22 0635 97.7 F (36.5 C)     Temp Source 06/01/22 0635 Oral     SpO2 06/01/22 0635 97 %     Weight 06/01/22 0636 260 lb (117.9 kg)     Height 06/01/22 0636 6' (1.829 m)     Head Circumference --      Peak Flow --      Pain Score 06/01/22 0636 2     Pain Loc --      Pain Edu? --      Excl. in GC? --     Most recent vital signs: Vitals:   06/01/22 0635  BP: (!) 133/100  Pulse: 74  Resp: 16  Temp: 97.7 F (36.5 C)  SpO2: 97%     General: Awake, no distress.  CV:  Good peripheral perfusion.  Resp:  Normal effort.  Abd:  No distention.  Other:  Conjunctiva bilaterally are injected with yellow thick exudate present bilaterally.  PERRLA, EOMI's, no foreign body noted.  Visual acuity noted with left eye 20/30 and right eye 20/40.   ED Results / Procedures / Treatments   Labs (all labs ordered are listed, but only abnormal results are displayed) Labs Reviewed - No data to display     PROCEDURES:  Critical Care performed:   Procedures   MEDICATIONS ORDERED IN ED: Medications - No data to display   IMPRESSION / MDM / ASSESSMENT AND PLAN / ED COURSE  I reviewed the triage vital signs and the nursing notes.   Differential diagnosis includes, but is not  limited to, bacterial conjunctivitis, viral conjunctivitis, allergic conjunctivitis and foreign body bilaterally.  41 year old male presents to the ED with complaint of conjunctivitis for the last 2 days.  Patient has been dealing with his eyelashes matted shut and yellow drainage over the weekend.  On exam there is no foreign body noted but evidence consistent with bacterial conjunctivitis.  Patient was made aware that this is very contagious and note was given for him to remain out of work.  A prescription for Tobrex ophthalmic solution 2 drops every 4 hours while awake to each eye and to follow-up with [facility] if not improving or any worsening of his symptoms or vision.      Patient's presentation is most consistent with acute, uncomplicated illness.  FINAL CLINICAL IMPRESSION(S) / ED DIAGNOSES   Final diagnoses:  Bacterial conjunctivitis of both eyes     Rx / DC Orders   ED Discharge Orders          Ordered    tobramycin (TOBREX) 0.3 % ophthalmic solution  Every  4 hours        06/01/22 0739             Note:  This document was prepared using Dragon voice recognition software and may include unintentional dictation errors.   Tommi Rumps, PA-C 06/01/22 1610    Merwyn Katos, MD 06/01/22 [phone]

## 2022-06-01 NOTE — ED Notes (Signed)
See triage note  Presents with bilateral eye itching and drainage  Sx's started couple of days ago

## 2023-05-24 ENCOUNTER — Encounter: Payer: Self-pay | Admitting: *Deleted

## 2023-05-24 ENCOUNTER — Emergency Department
Admission: EM | Admit: 2023-05-24 | Discharge: 2023-05-25 | Disposition: A | Discharge status: Home / Self Care | Payer: Self-pay | Attending: Emergency Medicine | Admitting: Emergency Medicine

## 2023-05-24 ENCOUNTER — Other Ambulatory Visit: Payer: Self-pay

## 2023-05-24 DIAGNOSIS — E860 Dehydration: Secondary | ICD-10-CM | POA: Insufficient documentation

## 2023-05-24 LAB — CBC
HCT: 50.7 % (ref 39.0–52.0)
Hemoglobin: 16.2 g/dL (ref 13.0–17.0)
MCH: 27 pg (ref 26.0–34.0)
MCHC: 32 g/dL (ref 30.0–36.0)
MCV: 84.5 fL (ref 80.0–100.0)
Platelets: 351 10*3/uL (ref 150–400)
RBC: 6 MIL/uL — ABNORMAL HIGH (ref 4.22–5.81)
RDW: 14.6 % (ref 11.5–15.5)
WBC: 7.8 10*3/uL (ref 4.0–10.5)
nRBC: 0 % (ref 0.0–0.2)

## 2023-05-24 LAB — BASIC METABOLIC PANEL
Anion gap: 10 (ref 5–15)
BUN: 15 mg/dL (ref 6–20)
CO2: 22 mmol/L (ref 22–32)
Calcium: 8.9 mg/dL (ref 8.9–10.3)
Chloride: 103 mmol/L (ref 98–111)
Creatinine, Ser: 1.01 mg/dL (ref 0.61–1.24)
GFR, Estimated: >60 mL/min (ref >60)
Glucose, Bld: 103 mg/dL — ABNORMAL HIGH (ref 70–99)
Potassium: 3.9 mmol/L (ref 3.5–5.1)
Sodium: 135 mmol/L (ref 135–145)

## 2023-05-24 MED ORDER — SODIUM CHLORIDE 0.9 % IV BOLUS
1000.0000 mL | Freq: Once | INTRAVENOUS | Status: AC
  Administered 2023-05-24: 1000 mL via INTRAVENOUS

## 2023-05-24 NOTE — ED Triage Notes (Signed)
Pt reports he believes he is dehydrated.  Pt states cramps in legs, sweating a lot today.  Pt also ha nausea.  Pt alert  speech clear.

## 2023-05-24 NOTE — ED Provider Notes (Signed)
[facility] Provider Note  Patient Contact: 11:26 PM (approximate)   History   Dehydration   HPI  Peter Stanley is a 42 y.o. male who presents emerged department feeling mildly dehydrated.  Patient states he was out in the heat, drinking mostly caffeine over the weekend.  Started to feel shaky, felt like he was cramping and wanted some fluids.  Patient denies any chest pain, arrhythmia, abdominal pain.  Patient feels much improved after a liter of fluid here in the emergency department.     Physical Exam   Triage Vital Signs: ED Triage Vitals  Enc Vitals Group     BP 05/24/23 2113 (!) 138/92     Pulse Rate 05/24/23 2113 95     Resp 05/24/23 2113 20     Temp 05/24/23 2113 98.8 F (37.1 C)     Temp Source 05/24/23 2113 Oral     SpO2 05/24/23 2113 96 %     Weight 05/24/23 2111 260 lb (117.9 kg)     Height 05/24/23 2111 6' (1.829 m)     Head Circumference --      Peak Flow --      Pain Score 05/24/23 2111 0     Pain Loc --      Pain Edu? --      Excl. in GC? --     Most recent vital signs: Vitals:   05/24/23 2113  BP: (!) 138/92  Pulse: 95  Resp: 20  Temp: 98.8 F (37.1 C)  SpO2: 96%     General: Alert and in no acute distress.  Cardiovascular:  Good peripheral perfusion Respiratory: Normal respiratory effort without tachypnea or retractions. Lungs CTAB.  Musculoskeletal: Full range of motion to all extremities.  Neurologic:  No gross focal neurologic deficits are appreciated.  Skin:   No rash noted Other:   ED Results / Procedures / Treatments   Labs (all labs ordered are listed, but only abnormal results are displayed) Labs Reviewed  BASIC METABOLIC PANEL - Abnormal; Notable for the following components:      Result Value   Glucose, Bld 103 (*)    All other components within normal limits  CBC - Abnormal; Notable for the following components:   RBC 6.00 (*)    All other components within normal limits  URINALYSIS,  ROUTINE W REFLEX MICROSCOPIC     EKG     RADIOLOGY    No results found.  PROCEDURES:  Critical Care performed: No  Procedures   MEDICATIONS ORDERED IN ED: Medications  sodium chloride 0.9 % bolus 1,000 mL (1,000 mLs Intravenous New Bag/Given 05/24/23 2210)     IMPRESSION / MDM / ASSESSMENT AND PLAN / ED COURSE  I reviewed the triage vital signs and the nursing notes.                                 Differential diagnosis includes, but is not limited to, dehydration, hypokalemia, hyponatremia   Patient's presentation is most consistent with acute presentation with potential threat to life or bodily function.   Patient's diagnosis is consistent with mild dehydration.  Patient presents emergency department after being in the heat all weekend.  Was feeling muscle cramps and spasms.  Labs are reassuring.  No other concerning complaints or symptoms requiring further workup.  Patient feels much improved after a liter of fluid.  Encouraged rehydration orally.  Patient is agreeable  with this plan.  Follow-up primary care as needed..  Patient is given ED precautions to return to the ED for any worsening or new symptoms.     FINAL CLINICAL IMPRESSION(S) / ED DIAGNOSES   Final diagnoses:  Mild dehydration     Rx / DC Orders   ED Discharge Orders     None        Note:  This document was prepared using Dragon voice recognition software and may include unintentional dictation errors.   Lanette Hampshire 05/24/23 2331    Jene Every, MD 05/26/23 [phone]

## 2023-05-25 LAB — URINALYSIS, ROUTINE W REFLEX MICROSCOPIC
Bacteria, UA: NONE SEEN
Bilirubin Urine: NEGATIVE
Glucose, UA: NEGATIVE mg/dL
Hgb urine dipstick: NEGATIVE
Ketones, ur: NEGATIVE mg/dL
Leukocytes,Ua: NEGATIVE
Nitrite: NEGATIVE
Protein, ur: NEGATIVE mg/dL
Specific Gravity, Urine: 1.026 (ref 1.005–1.030)
Squamous Epithelial / HPF: NONE SEEN /HPF (ref 0–5)
WBC, UA: NONE SEEN WBC/hpf (ref 0–5)
pH: 5 (ref 5.0–8.0)

## 2023-08-19 ENCOUNTER — Encounter: Payer: Self-pay | Admitting: Emergency Medicine

## 2023-08-19 ENCOUNTER — Emergency Department: Payer: Self-pay

## 2023-08-19 ENCOUNTER — Emergency Department
Admission: EM | Admit: 2023-08-19 | Discharge: 2023-08-19 | Disposition: A | Discharge status: Home / Self Care | Payer: Self-pay | Attending: Emergency Medicine | Admitting: Emergency Medicine

## 2023-08-19 ENCOUNTER — Other Ambulatory Visit: Payer: Self-pay

## 2023-08-19 DIAGNOSIS — J209 Acute bronchitis, unspecified: Secondary | ICD-10-CM | POA: Insufficient documentation

## 2023-08-19 DIAGNOSIS — Z20822 Contact with and (suspected) exposure to covid-19: Secondary | ICD-10-CM | POA: Insufficient documentation

## 2023-08-19 LAB — RESP PANEL BY RT-PCR (RSV, FLU A&B, COVID)  RVPGX2
Influenza A by PCR: NEGATIVE
Influenza B by PCR: NEGATIVE
Resp Syncytial Virus by PCR: NEGATIVE
SARS Coronavirus 2 by RT PCR: NEGATIVE

## 2023-08-19 MED ORDER — PSEUDOEPH-BROMPHEN-DM 30-2-10 MG/5ML PO SYRP: 5.0000 mL | ORAL_SOLUTION | Freq: Four times a day (QID) | ORAL | 0 refills | Status: AC | PRN

## 2023-08-19 MED ORDER — DOXYCYCLINE HYCLATE 100 MG PO TABS: 100.0000 mg | ORAL_TABLET | Freq: Two times a day (BID) | ORAL | 0 refills | Status: AC

## 2023-08-19 NOTE — ED Provider Notes (Signed)
[facility] Provider Note    Event Date/Time   First MD Initiated Contact with Patient 08/19/23 1525     (approximate)   History   Nasal Congestion   HPI  Peter Stanley is a 42 y.o. male with history of allergies and anxiety presents emergency department with cough, nasal congestion for 4 days.  No known fever.  He is coughing up brownish-greenish dark mucus.  States has a odor to it.  Denies chest pain shortness of breath.  No vomiting or diarrhea      Physical Exam   Triage Vital Signs: ED Triage Vitals  Encounter Vitals Group     BP 08/19/23 1453 138/89     Systolic BP Percentile --      Diastolic BP Percentile --      Pulse Rate 08/19/23 1453 (!) 59     Resp 08/19/23 1453 19     Temp 08/19/23 1453 98.7 F (37.1 C)     Temp Source 08/19/23 1453 Oral     SpO2 08/19/23 1453 100 %     Weight 08/19/23 1454 260 lb (117.9 kg)     Height 08/19/23 1454 6' (1.829 m)     Head Circumference --      Peak Flow --      Pain Score 08/19/23 1454 0     Pain Loc --      Pain Education --      Exclude from Growth Chart --     Most recent vital signs: Vitals:   08/19/23 1453  BP: 138/89  Pulse: (!) 59  Resp: 19  Temp: 98.7 F (37.1 C)  SpO2: 100%     General: Awake, no distress.   CV:  Good peripheral perfusion. regular rate and  rhythm Resp:  Normal effort. Lungs CTA, cough is deep and wet Abd:  No distention.   Other:      ED Results / Procedures / Treatments   Labs (all labs ordered are listed, but only abnormal results are displayed) Labs Reviewed  RESP PANEL BY RT-PCR (RSV, FLU A&B, COVID)  [id number]     EKG     RADIOLOGY Chest x-ray    PROCEDURES:   Procedures   MEDICATIONS ORDERED IN ED: Medications - No data to display   IMPRESSION / MDM / ASSESSMENT AND PLAN / ED COURSE  I reviewed the triage vital signs and the nursing notes.                              Differential diagnosis includes, but is not  limited to, bronchitis, COVID, CAP URI  Patient's presentation is most consistent with acute illness / injury with system symptoms.   Chest x-ray, respiratory panel  Respiratory panel reassuring  X-ray independently reviewed interpreted by me as being negative for CAP.  Do not see any large infiltrates.  However radiologist reading is pending.  Will start the patient on a 7-day dose of doxycycline.  Bromfed cough syrup.  He is to follow-up with his regular doctor if not improved in 3 days.  Return if worsening.  He is in agreement treatment plan.  Discharged stable condition.     FINAL CLINICAL IMPRESSION(S) / ED DIAGNOSES   Final diagnoses:  Acute bronchitis, unspecified organism     Rx / DC Orders   ED Discharge Orders          Ordered    doxycycline (  VIBRA-TABS) 100 MG tablet  2 times daily        08/19/23 1603    brompheniramine-pseudoephedrine-DM 30-2-10 MG/5ML syrup  4 times daily PRN        08/19/23 1603             Note:  This document was prepared using Dragon voice recognition software and may include unintentional dictation errors.    Faythe Ghee, PA-C 08/19/23 1605    Sharman Cheek, MD 08/22/23 [phone]

## 2023-08-19 NOTE — ED Triage Notes (Signed)
Pt sts that he has been having severe nasal congestion for the last 4 days. Pt sts that his job wants him tested for COVID.

## 2024-01-27 ENCOUNTER — Emergency Department: Payer: Self-pay

## 2024-01-27 ENCOUNTER — Encounter: Payer: Self-pay | Admitting: Emergency Medicine

## 2024-01-27 ENCOUNTER — Other Ambulatory Visit: Payer: Self-pay

## 2024-01-27 DIAGNOSIS — R079 Chest pain, unspecified: Secondary | ICD-10-CM | POA: Insufficient documentation

## 2024-01-27 DIAGNOSIS — R197 Diarrhea, unspecified: Secondary | ICD-10-CM | POA: Insufficient documentation

## 2024-01-27 DIAGNOSIS — R112 Nausea with vomiting, unspecified: Secondary | ICD-10-CM | POA: Insufficient documentation

## 2024-01-27 LAB — CBC
HCT: 51 % (ref 39.0–52.0)
Hemoglobin: 16.5 g/dL (ref 13.0–17.0)
MCH: 27.3 pg (ref 26.0–34.0)
MCHC: 32.4 g/dL (ref 30.0–36.0)
MCV: 84.3 fL (ref 80.0–100.0)
Platelets: 408 10*3/uL — ABNORMAL HIGH (ref 150–400)
RBC: 6.05 MIL/uL — ABNORMAL HIGH (ref 4.22–5.81)
RDW: 14.6 % (ref 11.5–15.5)
WBC: 16.4 10*3/uL — ABNORMAL HIGH (ref 4.0–10.5)
nRBC: 0 % (ref 0.0–0.2)

## 2024-01-27 NOTE — ED Triage Notes (Signed)
 Patient ambulatory to triage with steady gait, without difficulty or distress noted; pt reports PTA had onset left sided CP radiating into middle with no accomp symptoms; st hx of same with anxiety

## 2024-01-28 ENCOUNTER — Emergency Department
Admission: EM | Admit: 2024-01-28 | Discharge: 2024-01-28 | Disposition: A | Discharge status: Home / Self Care | Payer: Self-pay | Attending: Emergency Medicine | Admitting: Emergency Medicine

## 2024-01-28 DIAGNOSIS — K529 Noninfective gastroenteritis and colitis, unspecified: Secondary | ICD-10-CM

## 2024-01-28 DIAGNOSIS — R079 Chest pain, unspecified: Secondary | ICD-10-CM

## 2024-01-28 LAB — BASIC METABOLIC PANEL
Anion gap: 13 (ref 5–15)
BUN: 19 mg/dL (ref 6–20)
CO2: 19 mmol/L — ABNORMAL LOW (ref 22–32)
Calcium: 9.2 mg/dL (ref 8.9–10.3)
Chloride: 106 mmol/L (ref 98–111)
Creatinine, Ser: 0.93 mg/dL (ref 0.61–1.24)
GFR, Estimated: >60 mL/min (ref >60)
Glucose, Bld: 112 mg/dL — ABNORMAL HIGH (ref 70–99)
Potassium: 4 mmol/L (ref 3.5–5.1)
Sodium: 138 mmol/L (ref 135–145)

## 2024-01-28 LAB — TROPONIN I (HIGH SENSITIVITY)
Troponin I (High Sensitivity): 5 ng/L (ref <18)
Troponin I (High Sensitivity): 5 ng/L (ref <18)

## 2024-01-28 MED ORDER — ONDANSETRON 4 MG PO TBDP: 4.0000 mg | ORAL_TABLET | Freq: Three times a day (TID) | ORAL | 0 refills | Status: AC | PRN

## 2024-01-28 MED ORDER — HYDROXYZINE HCL 25 MG PO TABS
25.0000 mg | ORAL_TABLET | Freq: Once | ORAL | Status: AC
  Administered 2024-01-28: 25 mg via ORAL
  Filled 2024-01-28: qty 1

## 2024-01-28 MED ORDER — ONDANSETRON HCL 4 MG/2ML IJ SOLN
4.0000 mg | Freq: Once | INTRAMUSCULAR | Status: AC
  Administered 2024-01-28: 4 mg via INTRAVENOUS
  Filled 2024-01-28: qty 2

## 2024-01-28 MED ORDER — SODIUM CHLORIDE 0.9 % IV BOLUS
1000.0000 mL | Freq: Once | INTRAVENOUS | Status: AC
  Administered 2024-01-28: 1000 mL via INTRAVENOUS

## 2024-01-28 MED ORDER — KETOROLAC TROMETHAMINE 30 MG/ML IJ SOLN
15.0000 mg | Freq: Once | INTRAMUSCULAR | Status: AC
  Administered 2024-01-28: 15 mg via INTRAVENOUS
  Filled 2024-01-28: qty 1

## 2024-01-28 NOTE — Discharge Instructions (Signed)
 Please take your nausea medication as needed, as prescribed.  Please drink plenty of fluids and obtain plenty of rest.  Return to the emergency department for any return of/worsening chest pain or any other symptom person concerning to yourself.

## 2024-01-28 NOTE — ED Provider Notes (Signed)
 [facility] Provider Note    Event Date/Time   First MD Initiated Contact with Patient 01/28/24 0120     (approximate)  History   Chief Complaint: Chest Pain  HPI  Peter Stanley is a 43 y.o. male with a past medical history of anxiety who presents to the emergency department for chest pain nausea vomiting diarrhea.  According to the patient since around 3:00 yesterday morning he has been experiencing pain in the chest.  Patient does admit to a history of anxiety with chest pain in the past but has been quite sometime since he has had significant chest pain like he experienced today.  Patient states that this evening he had chest pain once again so he came to the emergency department for evaluation.  Patient also states starting this evening he has been nauseated and had episode of vomiting and diarrhea.  States his daughter had similar symptoms several days ago.  No fever.  No abdominal pain.  Physical Exam   Triage Vital Signs: ED Triage Vitals  Encounter Vitals Group     BP 01/27/24 2344 (!) 129/93     Systolic BP Percentile --      Diastolic BP Percentile --      Pulse Rate 01/27/24 2344 90     Resp 01/27/24 2344 18     Temp 01/27/24 2344 98.1 F (36.7 C)     Temp Source 01/27/24 2344 Oral     SpO2 01/27/24 2344 95 %     Weight 01/27/24 2339 263 lb (119.3 kg)     Height 01/27/24 2339 6' (1.829 m)     Head Circumference --      Peak Flow --      Pain Score 01/27/24 2339 8     Pain Loc --      Pain Education --      Exclude from Growth Chart --     Most recent vital signs: Vitals:   01/27/24 2344  BP: (!) 129/93  Pulse: 90  Resp: 18  Temp: 98.1 F (36.7 C)  SpO2: 95%    General: Awake, no distress.  CV:  Good peripheral perfusion.  Regular rate and rhythm  Resp:  Normal effort.  Equal breath sounds bilaterally.  Abd:  No distention.  Soft  ED Results / Procedures / Treatments   EKG  EKG viewed and interpreted by myself shows  a normal sinus rhythm 88 bpm with a narrow QRS, rightward axis, normal intervals, no concerning ST changes.  RADIOLOGY  I have reviewed and interpreted chest x-ray images.  No consolidation on my evaluation. Radiology is read the x-ray as negative.   MEDICATIONS ORDERED IN ED: Medications  sodium chloride 0.9 % bolus 1,000 mL (has no administration in time range)  ondansetron (ZOFRAN) injection 4 mg (has no administration in time range)  ketorolac (TORADOL) 30 MG/ML injection 15 mg (has no administration in time range)  hydrOXYzine (ATARAX) tablet 25 mg (has no administration in time range)     IMPRESSION / MDM / ASSESSMENT AND PLAN / ED COURSE  I reviewed the triage vital signs and the nursing notes.  Patient's presentation is most consistent with acute presentation with potential threat to life or bodily function.  Patient presents to the emergency department for chest pain has been intermittent since yesterday.  Now with nausea vomiting and diarrhea.  Patient states his daughter has similar symptoms.  Patient's workup does show mild leukocytosis otherwise reassuring CBC.  Chemistry  overall reassuring with mild dehydration with anion gap of 13 troponin reassuringly negative.  Chest x-ray is clear and EKG shows no concerning findings.  We will repeat a troponin as a precaution after 2 hours.  Will IV hydrate treat with Zofran and continue to closely monitor.  Patient does state anxiety.  Will dose 25 mg of hydroxyzine while awaiting further results.  Patient is repeat troponin remains negative.  Given the patient's reassuring workup I believe the patient safe for discharge home.  Highly suspect more of a viral gastroenteritis given the patient's nausea vomiting and diarrhea.  We will discharge with Zofran to be used as needed fluids and supportive care.  Patient agreeable to plan.  FINAL CLINICAL IMPRESSION(S) / ED DIAGNOSES   Chest pain Nausea vomiting diarrhea  Note:  This document  was prepared using Dragon voice recognition software and may include unintentional dictation errors.   Minna Antis, MD 01/28/24 [phone]
